# Patient Record
Sex: Female | Born: 1965 | Race: White | Hispanic: No | Marital: Married | Smoking: Never smoker
Health system: Southern US, Community
[De-identification: ages and names within clinical notes are randomized; demographics above are authoritative.]

## PROBLEM LIST (undated history)

## (undated) DIAGNOSIS — M722 Plantar fascial fibromatosis: Secondary | ICD-10-CM

## (undated) DIAGNOSIS — Z889 Allergy status to unspecified drugs, medicaments and biological substances status: Secondary | ICD-10-CM

## (undated) DIAGNOSIS — K219 Gastro-esophageal reflux disease without esophagitis: Secondary | ICD-10-CM

## (undated) DIAGNOSIS — Z9889 Other specified postprocedural states: Secondary | ICD-10-CM

## (undated) DIAGNOSIS — R112 Nausea with vomiting, unspecified: Secondary | ICD-10-CM

## (undated) DIAGNOSIS — F32A Depression, unspecified: Secondary | ICD-10-CM

## (undated) DIAGNOSIS — F419 Anxiety disorder, unspecified: Secondary | ICD-10-CM

## (undated) DIAGNOSIS — T4145XA Adverse effect of unspecified anesthetic, initial encounter: Secondary | ICD-10-CM

## (undated) DIAGNOSIS — J398 Other specified diseases of upper respiratory tract: Secondary | ICD-10-CM

## (undated) DIAGNOSIS — F329 Major depressive disorder, single episode, unspecified: Secondary | ICD-10-CM

## (undated) DIAGNOSIS — T8859XA Other complications of anesthesia, initial encounter: Secondary | ICD-10-CM

## (undated) HISTORY — PX: HAND SURGERY: SHX662

## (undated) HISTORY — PX: WISDOM TOOTH EXTRACTION: SHX21

## (undated) HISTORY — PX: ENDOMETRIAL ABLATION: SHX621

---

## 1990-01-10 HISTORY — PX: SHOULDER ARTHROSCOPY: SHX128

## 1998-01-02 ENCOUNTER — Emergency Department (HOSPITAL_COMMUNITY): Admission: EM | Admit: 1998-01-02 | Discharge: 1998-01-02 | Payer: Self-pay | Admitting: Emergency Medicine

## 2005-08-26 ENCOUNTER — Encounter: Admission: RE | Admit: 2005-08-26 | Discharge: 2005-08-26 | Payer: Self-pay | Admitting: Obstetrics and Gynecology

## 2006-10-19 ENCOUNTER — Encounter: Admission: RE | Admit: 2006-10-19 | Discharge: 2006-10-19 | Payer: Self-pay | Admitting: Obstetrics and Gynecology

## 2007-10-23 ENCOUNTER — Encounter: Admission: RE | Admit: 2007-10-23 | Discharge: 2007-10-23 | Payer: Self-pay | Admitting: Obstetrics and Gynecology

## 2008-12-11 ENCOUNTER — Encounter: Admission: RE | Admit: 2008-12-11 | Discharge: 2008-12-11 | Payer: Self-pay | Admitting: Obstetrics and Gynecology

## 2010-01-20 ENCOUNTER — Encounter
Admission: RE | Admit: 2010-01-20 | Discharge: 2010-01-20 | Payer: Self-pay | Source: Home / Self Care | Attending: Obstetrics and Gynecology | Admitting: Obstetrics and Gynecology

## 2011-02-09 ENCOUNTER — Other Ambulatory Visit: Payer: Self-pay | Admitting: Obstetrics and Gynecology

## 2011-02-09 DIAGNOSIS — Z1231 Encounter for screening mammogram for malignant neoplasm of breast: Secondary | ICD-10-CM

## 2011-02-22 ENCOUNTER — Other Ambulatory Visit: Payer: Self-pay | Admitting: Orthopedic Surgery

## 2011-02-22 DIAGNOSIS — M79646 Pain in unspecified finger(s): Secondary | ICD-10-CM

## 2011-02-28 ENCOUNTER — Ambulatory Visit
Admission: RE | Admit: 2011-02-28 | Discharge: 2011-02-28 | Disposition: A | Discharge status: Home / Self Care | Payer: BC Managed Care – PPO | Source: Ambulatory Visit | Attending: Orthopedic Surgery | Admitting: Orthopedic Surgery

## 2011-02-28 DIAGNOSIS — M79646 Pain in unspecified finger(s): Secondary | ICD-10-CM

## 2011-03-02 ENCOUNTER — Ambulatory Visit
Admission: RE | Admit: 2011-03-02 | Discharge: 2011-03-02 | Disposition: A | Discharge status: Home / Self Care | Payer: BC Managed Care – PPO | Source: Ambulatory Visit | Attending: Obstetrics and Gynecology | Admitting: Obstetrics and Gynecology

## 2011-03-02 ENCOUNTER — Other Ambulatory Visit: Payer: Self-pay | Admitting: Orthopedic Surgery

## 2011-03-02 DIAGNOSIS — Z1231 Encounter for screening mammogram for malignant neoplasm of breast: Secondary | ICD-10-CM

## 2011-03-07 ENCOUNTER — Encounter (HOSPITAL_BASED_OUTPATIENT_CLINIC_OR_DEPARTMENT_OTHER): Payer: Self-pay | Admitting: *Deleted

## 2011-03-07 NOTE — Progress Notes (Signed)
No labs needed

## 2011-03-09 ENCOUNTER — Encounter (HOSPITAL_BASED_OUTPATIENT_CLINIC_OR_DEPARTMENT_OTHER): Payer: Self-pay | Admitting: Certified Registered Nurse Anesthetist

## 2011-03-09 ENCOUNTER — Encounter (HOSPITAL_BASED_OUTPATIENT_CLINIC_OR_DEPARTMENT_OTHER): Payer: Self-pay | Admitting: Orthopedic Surgery

## 2011-03-09 ENCOUNTER — Encounter (HOSPITAL_BASED_OUTPATIENT_CLINIC_OR_DEPARTMENT_OTHER)
Admission: RE | Disposition: A | Discharge status: Home Health | Payer: Self-pay | Source: Ambulatory Visit | Attending: Orthopedic Surgery

## 2011-03-09 ENCOUNTER — Ambulatory Visit (HOSPITAL_BASED_OUTPATIENT_CLINIC_OR_DEPARTMENT_OTHER)
Admission: RE | Admit: 2011-03-09 | Discharge: 2011-03-09 | Disposition: A | Discharge status: Home Health | Payer: BC Managed Care – PPO | Source: Ambulatory Visit | Attending: Orthopedic Surgery | Admitting: Orthopedic Surgery

## 2011-03-09 ENCOUNTER — Ambulatory Visit (HOSPITAL_BASED_OUTPATIENT_CLINIC_OR_DEPARTMENT_OTHER): Payer: BC Managed Care – PPO | Admitting: Certified Registered Nurse Anesthetist

## 2011-03-09 DIAGNOSIS — X58XXXA Exposure to other specified factors, initial encounter: Secondary | ICD-10-CM | POA: Insufficient documentation

## 2011-03-09 DIAGNOSIS — S66819A Strain of other specified muscles, fascia and tendons at wrist and hand level, unspecified hand, initial encounter: Secondary | ICD-10-CM | POA: Insufficient documentation

## 2011-03-09 DIAGNOSIS — Y9323 Activity, snow (alpine) (downhill) skiing, snow boarding, sledding, tobogganing and snow tubing: Secondary | ICD-10-CM | POA: Insufficient documentation

## 2011-03-09 DIAGNOSIS — IMO0002 Reserved for concepts with insufficient information to code with codable children: Secondary | ICD-10-CM | POA: Insufficient documentation

## 2011-03-09 DIAGNOSIS — Y998 Other external cause status: Secondary | ICD-10-CM | POA: Insufficient documentation

## 2011-03-09 DIAGNOSIS — S63599A Other specified sprain of unspecified wrist, initial encounter: Secondary | ICD-10-CM | POA: Insufficient documentation

## 2011-03-09 HISTORY — DX: Nausea with vomiting, unspecified: R11.2

## 2011-03-09 HISTORY — DX: Adverse effect of unspecified anesthetic, initial encounter: T41.45XA

## 2011-03-09 HISTORY — DX: Other complications of anesthesia, initial encounter: T88.59XA

## 2011-03-09 HISTORY — DX: Allergy status to unspecified drugs, medicaments and biological substances: Z88.9

## 2011-03-09 HISTORY — DX: Other specified postprocedural states: Z98.890

## 2011-03-09 LAB — POCT HEMOGLOBIN-HEMACUE: Hemoglobin: 13.5 g/dL (ref 12.0–15.0)

## 2011-03-09 SURGERY — REPAIR, LIGAMENT, ULNAR COLLATERAL
Anesthesia: Monitor Anesthesia Care | Site: Thumb | Laterality: Left | Wound class: Clean

## 2011-03-09 MED ORDER — ONDANSETRON HCL 4 MG/2ML IJ SOLN
INTRAMUSCULAR | Status: DC | PRN
  Administered 2011-03-09: 4 mg via INTRAVENOUS

## 2011-03-09 MED ORDER — CHLORHEXIDINE GLUCONATE 4 % EX LIQD: 60.0000 mL | Freq: Once | CUTANEOUS | Status: DC

## 2011-03-09 MED ORDER — MIDAZOLAM HCL 5 MG/5ML IJ SOLN
INTRAMUSCULAR | Status: DC | PRN
  Administered 2011-03-09: 1 mg via INTRAVENOUS

## 2011-03-09 MED ORDER — LACTATED RINGERS IV SOLN
INTRAVENOUS | Status: DC
  Administered 2011-03-09: 13:00:00 via INTRAVENOUS

## 2011-03-09 MED ORDER — CEFAZOLIN SODIUM 1-5 GM-% IV SOLN
1.0000 g | INTRAVENOUS | Status: DC
  Administered 2011-03-09: 1 g via INTRAVENOUS

## 2011-03-09 MED ORDER — FENTANYL CITRATE 0.05 MG/ML IJ SOLN: 25.0000 ug | INTRAMUSCULAR | Status: DC | PRN

## 2011-03-09 MED ORDER — METOCLOPRAMIDE HCL 5 MG/ML IJ SOLN: 10.0000 mg | Freq: Once | INTRAMUSCULAR | Status: DC | PRN

## 2011-03-09 MED ORDER — DEXAMETHASONE SODIUM PHOSPHATE 4 MG/ML IJ SOLN
INTRAMUSCULAR | Status: DC | PRN
  Administered 2011-03-09: 4 mg via INTRAVENOUS

## 2011-03-09 MED ORDER — CEFAZOLIN SODIUM 1-5 GM-% IV SOLN: 1.0000 g | INTRAVENOUS | Status: DC

## 2011-03-09 MED ORDER — FENTANYL CITRATE 0.05 MG/ML IJ SOLN
50.0000 ug | INTRAMUSCULAR | Status: DC | PRN
  Administered 2011-03-09: 100 ug via INTRAVENOUS

## 2011-03-09 MED ORDER — ROPIVACAINE HCL 5 MG/ML IJ SOLN
INTRAMUSCULAR | Status: DC | PRN
  Administered 2011-03-09: 25 mL via EPIDURAL

## 2011-03-09 MED ORDER — MORPHINE SULFATE 4 MG/ML IJ SOLN: 0.0500 mg/kg | INTRAMUSCULAR | Status: DC | PRN

## 2011-03-09 MED ORDER — PROPOFOL 10 MG/ML IV EMUL
INTRAVENOUS | Status: DC | PRN
  Administered 2011-03-09: 75 ug/kg/min via INTRAVENOUS

## 2011-03-09 MED ORDER — HYDROCODONE-ACETAMINOPHEN 5-500 MG PO TABS: 1.0000 | ORAL_TABLET | ORAL | Status: AC | PRN

## 2011-03-09 MED ORDER — LIDOCAINE HCL (CARDIAC) 20 MG/ML IV SOLN
INTRAVENOUS | Status: DC | PRN
  Administered 2011-03-09: 60 mg via INTRAVENOUS

## 2011-03-09 MED ORDER — MIDAZOLAM HCL 2 MG/2ML IJ SOLN
0.5000 mg | INTRAMUSCULAR | Status: DC | PRN
  Administered 2011-03-09: 2 mg via INTRAVENOUS

## 2011-03-09 MED ORDER — LIDOCAINE HCL 1 % IJ SOLN
INTRAMUSCULAR | Status: DC | PRN
  Administered 2011-03-09: 2 mL via INTRADERMAL

## 2011-03-09 SURGICAL SUPPLY — 62 items
ANCHOR JUGGERKNOT SHT 1.4 SOFT (Anchor) ×2 IMPLANT
BAG DECANTER FOR FLEXI CONT (MISCELLANEOUS) IMPLANT
BANDAGE GAUZE ELAST BULKY 4 IN (GAUZE/BANDAGES/DRESSINGS) ×2 IMPLANT
BLADE MINI RND TIP GREEN BEAV (BLADE) ×2 IMPLANT
BLADE SURG 15 STRL LF DISP TIS (BLADE) ×1 IMPLANT
BLADE SURG 15 STRL SS (BLADE) ×2
BNDG CMPR 9X4 STRL LF SNTH (GAUZE/BANDAGES/DRESSINGS) ×1
BNDG COHESIVE 3X5 TAN STRL LF (GAUZE/BANDAGES/DRESSINGS) ×2 IMPLANT
BNDG ESMARK 4X9 LF (GAUZE/BANDAGES/DRESSINGS) ×2 IMPLANT
CHLORAPREP W/TINT 26ML (MISCELLANEOUS) ×2 IMPLANT
CLOTH BEACON ORANGE TIMEOUT ST (SAFETY) ×2 IMPLANT
CORDS BIPOLAR (ELECTRODE) ×2 IMPLANT
COVER MAYO STAND STRL (DRAPES) ×2 IMPLANT
COVER TABLE BACK 60X90 (DRAPES) ×2 IMPLANT
CUFF TOURNIQUET SINGLE 18IN (TOURNIQUET CUFF) ×2 IMPLANT
DECANTER SPIKE VIAL GLASS SM (MISCELLANEOUS) IMPLANT
DRAPE EXTREMITY T 121X128X90 (DRAPE) ×2 IMPLANT
DRAPE OEC MINIVIEW 54X84 (DRAPES) IMPLANT
DRAPE SURG 17X23 STRL (DRAPES) ×2 IMPLANT
DRSG KUZMA FLUFF (GAUZE/BANDAGES/DRESSINGS) IMPLANT
GAUZE XEROFORM 1X8 LF (GAUZE/BANDAGES/DRESSINGS) ×2 IMPLANT
GLOVE BIO SURGEON STRL SZ 6.5 (GLOVE) ×2 IMPLANT
GLOVE SURG ORTHO 8.0 STRL STRW (GLOVE) ×2 IMPLANT
GOWN BRE IMP PREV XXLGXLNG (GOWN DISPOSABLE) ×2 IMPLANT
GOWN PREVENTION PLUS XLARGE (GOWN DISPOSABLE) ×2 IMPLANT
NDL SAFETY ECLIPSE 18X1.5 (NEEDLE) IMPLANT
NEEDLE 27GAX1X1/2 (NEEDLE) IMPLANT
NEEDLE FISTULA 1/2 CIRCLE (NEEDLE) IMPLANT
NEEDLE HYPO 18GX1.5 SHARP (NEEDLE)
NS IRRIG 1000ML POUR BTL (IV SOLUTION) ×2 IMPLANT
PACK BASIN DAY SURGERY FS (CUSTOM PROCEDURE TRAY) ×2 IMPLANT
PAD CAST 3X4 CTTN HI CHSV (CAST SUPPLIES) ×1 IMPLANT
PAD CAST 4YDX4 CTTN HI CHSV (CAST SUPPLIES) IMPLANT
PADDING CAST ABS 4INX4YD NS (CAST SUPPLIES) ×1
PADDING CAST ABS COTTON 4X4 ST (CAST SUPPLIES) ×1 IMPLANT
PADDING CAST COTTON 3X4 STRL (CAST SUPPLIES) ×2
PADDING CAST COTTON 4X4 STRL (CAST SUPPLIES)
PASSER SUT SWANSON 36MM LOOP (INSTRUMENTS) IMPLANT
SLEEVE SCD COMPRESS KNEE MED (MISCELLANEOUS) ×2 IMPLANT
SPLINT PLASTER CAST XFAST 3X15 (CAST SUPPLIES) ×15 IMPLANT
SPLINT PLASTER XTRA FASTSET 3X (CAST SUPPLIES) ×15
SPONGE GAUZE 4X4 12PLY (GAUZE/BANDAGES/DRESSINGS) ×2 IMPLANT
STOCKINETTE 4X48 STRL (DRAPES) ×2 IMPLANT
SUT ETHIBOND 3-0 V-5 (SUTURE) IMPLANT
SUT FIBERWIRE 2-0 18 17.9 3/8 (SUTURE)
SUT FIBERWIRE 3-0 18 TAPR NDL (SUTURE)
SUT MERSILENE 2.0 SH NDLE (SUTURE) IMPLANT
SUT MERSILENE 4 0 P 3 (SUTURE) ×2 IMPLANT
SUT MERSILENE 6 0 P 1 (SUTURE) IMPLANT
SUT SILK 4 0 PS 2 (SUTURE) IMPLANT
SUT STEEL 3 0 (SUTURE) IMPLANT
SUT STEEL 4 0 (SUTURE) IMPLANT
SUT STEEL 4 0 V 26 (SUTURE) IMPLANT
SUT VICRYL 4-0 PS2 18IN ABS (SUTURE) IMPLANT
SUT VICRYL RAPIDE 4/0 PS 2 (SUTURE) ×2 IMPLANT
SUTURE FIBERWR 2-0 18 17.9 3/8 (SUTURE) IMPLANT
SUTURE FIBERWR 3-0 18 TAPR NDL (SUTURE) IMPLANT
SYR BULB 3OZ (MISCELLANEOUS) ×2 IMPLANT
SYR CONTROL 10ML LL (SYRINGE) IMPLANT
TOWEL OR 17X24 6PK STRL BLUE (TOWEL DISPOSABLE) ×2 IMPLANT
UNDERPAD 30X30 INCONTINENT (UNDERPADS AND DIAPERS) ×2 IMPLANT
WATER STERILE IRR 1000ML POUR (IV SOLUTION) IMPLANT

## 2011-03-09 NOTE — Brief Op Note (Signed)
03/09/2011  2:36 PM  PATIENT:  Olevia Perches  46 y.o. female  PRE-OPERATIVE DIAGNOSIS:  game keepers left thumb  POST-OPERATIVE DIAGNOSIS:  Game Keepers Left Thumb  PROCEDURE:  Procedure(s) (LRB): ULNAR COLLATERAL LIGAMENT REPAIR (Left)  SURGEON:  Surgeon(s) and Role:    * Nicki Reaper, MD - Primary  PHYSICIAN ASSISTANT:   ASSISTANTS: none   ANESTHESIA:   regional and general  EBL:  Total I/O In: 900 [I.V.:900] Out: -   BLOOD ADMINISTERED:none  DRAINS: none   LOCAL MEDICATIONS USED:  NONE  SPECIMEN:  No Specimen  DISPOSITION OF SPECIMEN:  N/A  COUNTS:  YES  TOURNIQUET:   Total Tourniquet Time Documented: Upper Arm (Left) - 41 minutes  DICTATION: .Other Dictation: Dictation Number [id number]  PLAN OF CARE: Discharge to home after PACU  PATIENT DISPOSITION:  PACU - hemodynamically stable.

## 2011-03-09 NOTE — Anesthesia Preprocedure Evaluation (Signed)
Anesthesia Evaluation  Patient identified by MRN, date of birth, ID band Patient awake    Reviewed: Allergy & Precautions, H&P , NPO status , Patient's Chart, lab work & pertinent test results, reviewed documented beta blocker date and time   History of Anesthesia Complications (+) PONV  Airway Mallampati: II TM Distance: >3 FB Neck ROM: full    Dental   Pulmonary neg pulmonary ROS,          Cardiovascular neg cardio ROS     Neuro/Psych Negative Neurological ROS  Negative Psych ROS   GI/Hepatic negative GI ROS, Neg liver ROS,   Endo/Other  Negative Endocrine ROS  Renal/GU negative Renal ROS  Genitourinary negative   Musculoskeletal   Abdominal   Peds  Hematology negative hematology ROS (+)   Anesthesia Other Findings See surgeon's H&P   Reproductive/Obstetrics negative OB ROS                           Anesthesia Physical Anesthesia Plan  ASA: I  Anesthesia Plan: MAC   Post-op Pain Management: MAC Combined w/ Regional for Post-op pain   Induction:   Airway Management Planned: Simple Face Mask  Additional Equipment:   Intra-op Plan:   Post-operative Plan:   Informed Consent: I have reviewed the patients History and Physical, chart, labs and discussed the procedure including the risks, benefits and alternatives for the proposed anesthesia with the patient or authorized representative who has indicated his/her understanding and acceptance.     Plan Discussed with: CRNA and Surgeon  Anesthesia Plan Comments:         Anesthesia Quick Evaluation

## 2011-03-09 NOTE — Progress Notes (Signed)
Tolerated well ,  Resting at present

## 2011-03-09 NOTE — Anesthesia Procedure Notes (Addendum)
Anesthesia Regional Block:  Supraclavicular block  Pre-Anesthetic Checklist: ,, timeout performed, Correct Patient, Correct Site, Correct Laterality, Correct Procedure, Correct Position, site marked, Risks and benefits discussed,  Surgical consent,  Pre-op evaluation,  At surgeon's request and post-op pain management  Laterality: Left  Prep: chloraprep       Needles:   Needle Type: Other   (Arrow Echogenic)   Needle Length: 9cm  Needle Gauge: 21    Additional Needles:  Procedures: ultrasound guided Supraclavicular block Narrative:  Start time: 03/09/2011 1:04 PM End time: 03/09/2011 1:11 PM Injection made incrementally with aspirations every 5 mL.  Performed by: Personally  Anesthesiologist: C Frederick  Additional Notes: Ultrasound guidance used to: id relevant anatomy, confirm needle position, local anesthetic spread, avoidance of vascular puncture. Picture saved. No complications. Block performed personally by Janetta Hora. Gelene Mink, MD    Supraclavicular block Procedure Name: MAC Date/Time: 03/09/2011 1:42 PM Performed by: Donye Dauenhauer D Pre-anesthesia Checklist: Patient identified, Emergency Drugs available, Suction available, Patient being monitored and Timeout performed Oxygen Delivery Method: Simple face mask

## 2011-03-09 NOTE — H&P (Signed)
Toni Gordon is a 46 year old right hand dominant female referred by Dr. Isabel Caprice for a consultation with respect to an injury to her left thumb while skiing. This injury occurred on 02-16-11. She was placed in a splint. This has helped with her pain. She states that she has hurt it in the past but not to this extent. No history of diabetes, thyroid problems, arthritis or gout. She localizes the pain around her MCP joint. She complains of intermittent, severe, sharp pain especially if she jams it with a feeling of swelling and weakness. She states it has gotten somewhat better. Rest, heat, ice and elevation have helped. She has been taking Advil and wearing a brace.   Past Medical History: She has no allergies. She is on vitamins and allergy shots. She has had a shoulder impingement.  Family Medical History: Positive for arthritis, otherwise negative.  Social History: She does not smoke. She drinks socially. She is married. She is Interior and spatial designer for [employer].  Review of Systems: Positive for glasses, otherwise negative for 14 points. Toni Gordon is an 46 y.o. female.   Chief Complaint: UCL MCP Lt Thumb Tear HPI: see above  Past Medical History  Diagnosis Date  . Complication of anesthesia   . PONV (postoperative nausea and vomiting)   . Multiple allergies     Past Surgical History  Procedure Date  . Shoulder arthroscopy 1992    lt ligament repair  . Wisdom tooth extraction     History reviewed. No pertinent family history. Social History:  reports that she has never smoked. She does not have any smokeless tobacco history on file. She reports that she drinks alcohol. She reports that she does not use illicit drugs.  Allergies:  Allergies  Allergen Reactions  . Codeine Nausea And Vomiting    No current facility-administered medications on file as of .   Medications Prior to Admission  Medication Sig Dispense Refill  . calcium carbonate (OS-CAL - DOSED IN MG OF  ELEMENTAL CALCIUM) 1250 MG tablet Take 1 tablet by mouth daily.      . cetirizine (ZYRTEC) 10 MG tablet Take 10 mg by mouth daily.      . cholecalciferol (VITAMIN D) 1000 UNITS tablet Take 1,000 Units by mouth daily.      Marland Kitchen desloratadine (CLARINEX) 5 MG tablet Take 5 mg by mouth daily.      . fish oil-omega-3 fatty acids 1000 MG capsule Take 2 g by mouth daily.      Marland Kitchen FLUoxetine (PROZAC) 10 MG tablet Take 10 mg by mouth daily. Takes one 5 days a month      . Multiple Vitamin (MULTIVITAMIN) capsule Take 1 capsule by mouth daily.        No results found for this or any previous visit (from the past 48 hour(s)).  No results found.   Pertinent items are noted in HPI.  Height 5' 4.5" (1.638 m), weight 57.607 kg (127 lb), last menstrual period 02/08/2011.  General appearance: alert, cooperative and appears stated age Head: Normocephalic, without obvious abnormality Neck: no adenopathy Resp: clear to auscultation bilaterally Cardio: regular rate and rhythm, S1, S2 normal, no murmur, click, rub or gallop GI: soft, non-tender; bowel sounds normal; no masses,  no organomegaly Extremities: extremities normal, atraumatic, no cyanosis or edema Pulses: 2+ and symmetric Skin: Skin color, texture, turgor normal. No rashes or lesions Neurologic: Grossly normal Incision/Wound: na  Assessment/Plan We have discussed repair, reconstruction of this.  The pre,  peri and postoperative course were discussed along with the risks and complications.  The patient is aware there is no guarantee with the surgery, possibility of infection, recurrence, injury to arteries, nerves, tendons, incomplete relief of symptoms and dystrophy, the fact that we will attempt to regain stability rather than full mobility, the possibility of using abductor pollicis longus tendon as a reconstructive procedure.  She would like to proceed. This will be scheduled as an outpatient under regional anesthesia, reconstruction, repair ulnar  collateral ligament metacarpophalangeal joint left thumb.  Madolyn Ackroyd R 03/09/2011, 10:33 AM

## 2011-03-09 NOTE — Op Note (Signed)
Dictated number: [id number]

## 2011-03-09 NOTE — Anesthesia Postprocedure Evaluation (Signed)
Anesthesia Post Note  Patient: Toni Gordon  Procedure(s) Performed: Procedure(s) (LRB): ULNAR COLLATERAL LIGAMENT REPAIR (Left)  Anesthesia type: General  Patient location: PACU  Post pain: Pain level controlled  Post assessment: Patient's Cardiovascular Status Stable  Last Vitals:  Filed Vitals:   03/09/11 1515  BP:   Pulse: 55  Temp:   Resp: 16    Post vital signs: Reviewed and stable  Level of consciousness: alert  Complications: No apparent anesthesia complications

## 2011-03-09 NOTE — Transfer of Care (Signed)
Immediate Anesthesia Transfer of Care Note  Patient: Toni Gordon  Procedure(s) Performed: Procedure(s) (LRB): ULNAR COLLATERAL LIGAMENT REPAIR (Left)  Patient Location: PACU  Anesthesia Type: MAC combined with regional for post-op pain  Level of Consciousness: awake, alert , oriented and patient cooperative  Airway & Oxygen Therapy: Patient Spontanous Breathing and Patient connected to face mask oxygen  Post-op Assessment: Report given to PACU RN and Post -op Vital signs reviewed and stable  Post vital signs: Reviewed and stable  Complications: No apparent anesthesia complications

## 2011-03-10 NOTE — Op Note (Signed)
NAME:  Gordon, Toni                 ACCOUNT NO.:  [id number]  MEDICAL RECORD NO.:  [id number]  LOCATION:                                 FACILITY:  PHYSICIAN:  Cindee Salt, M.D.            DATE OF BIRTH:  DATE OF PROCEDURE:  03/09/2011 DATE OF DISCHARGE:                              OPERATIVE REPORT   PREOPERATIVE DIAGNOSIS:  Ruptured ulnar collateral ligament, metacarpophalangeal joint, left thumb.  POSTOPERATIVE DIAGNOSIS:  Ruptured ulnar collateral ligament, metacarpophalangeal joint, left thumb.  OPERATION:  Repair of ulnar collateral ligament, metacarpophalangeal joint, left thumb.  SURGEON:  Cindee Salt, MD  ANESTHESIA:  Supraclavicular block general.  ANESTHESIOLOGIST:  Janetta Hora. Gelene Mink, MD  HISTORY:  The patient is a 46 year old female who suffered an injury to her left thumb.  MRI reveals a rupture of the ulnar collateral ligament where her pain discomfort is, which has not responded to conservative treatment.  She is admitted now for repair of reconstruction.  Pre, peri, and postoperative course have been discussed along with risks and complications.  She is aware that there is no guarantee with the surgery, possibility of infection, recurrence of injury to arteries, nerves, tendons, incomplete relief of symptoms, and dystrophy.  The possibility of further intervention being necessary in the future, but in the fact that we are going for stability rather than full mobility. In the preoperative area, the patient is seen, the extremity marked by both the patient and surgeon, and antibiotic given.  PROCEDURE:  The patient was brought to the operating room where a supraclavicular block and general anesthetic was carried out without difficulty.  She was prepped using ChloraPrep, supine position, left arm free.  A 3-minute dry time was allowed.  Time-out taken, confirming the patient and procedure.  The limb was exsanguinated with an Esmarch bandage.  Tourniquet was  placed high and the arm was inflated to 250 mmHg.  A curvilinear incision was made over the metacarpal proximal phalanx apex to the ulnar side and carried down through the subcutaneous tissue.  Dorsal sensory nerve of the thumb was identified and protected. This was found to be scarred into a center-type lesion, this was dissected free.  The adductor aponeurosis was incised with sharp dissection.  A portion of ligament was also present on the proximal phalanx.  This appeared to be a tear involving both the partial avulsion from the thumb metacarpal neck and distally from the proximal phalanx with majority being from the metacarpal head.  The collateral ligaments were then debrided.  A juggernaut anchor was placed into the metacarpal neck at the origin of the ulnar collateral ligament.  This was inserted. This was then brought through the ligament on the most dorsal aspect, then through the ligament at the base of the proximal phalanx and used this as an anchor to suture and repair the two point ends of the ligament; one proximally and one distally together.  This formed a very secure repair with no opening to stressing on the ulnar side.  The wound was irrigated prior to closure.  The adductor aponeurosis was repaired with figure-of-eight 4-0 Mersilene sutures and the skin  with a subcuticular 4-0 Vicryl repeat sutures.  Sterile compressive dressing, thumb spica splint was applied.  On the deflation of the tourniquet, all fingers were immediately pinked.  She was taken to the recovery room for observation in satisfactory condition.          ______________________________ Cindee Salt, M.D.     GK/MEDQ  D:  03/09/2011  T:  03/10/2011  Job:  [id number]

## 2013-05-27 ENCOUNTER — Other Ambulatory Visit: Payer: Self-pay | Admitting: Obstetrics and Gynecology

## 2013-05-27 DIAGNOSIS — R928 Other abnormal and inconclusive findings on diagnostic imaging of breast: Secondary | ICD-10-CM

## 2013-06-05 ENCOUNTER — Ambulatory Visit
Admission: RE | Admit: 2013-06-05 | Discharge: 2013-06-05 | Disposition: A | Discharge status: Home / Self Care | Payer: Commercial Managed Care - PPO | Source: Ambulatory Visit | Attending: Obstetrics and Gynecology | Admitting: Obstetrics and Gynecology

## 2013-06-05 DIAGNOSIS — R928 Other abnormal and inconclusive findings on diagnostic imaging of breast: Secondary | ICD-10-CM

## 2014-12-24 ENCOUNTER — Encounter (HOSPITAL_BASED_OUTPATIENT_CLINIC_OR_DEPARTMENT_OTHER): Payer: Self-pay | Admitting: *Deleted

## 2014-12-24 ENCOUNTER — Ambulatory Visit: Payer: Self-pay | Admitting: Otolaryngology

## 2014-12-24 NOTE — H&P (Signed)
  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 Discussed  I certainly agree with the diagnosis. I also agree with the plan for microlaryngoscopy with laser treatment of the stenotic segment, and balloon dilation. Recommend she continue on omeprazole indefinitely. I have counseled her on the importance of taking it first thing in the morning and not eating or drinking for one hour. Continue efforts to avoid caffeine and alcohol. It is very likely that she will require multiple procedures in the future. All questions were answered. We will schedule at her convenience. Reason For Visit  Voice and swallowing issues. HPI  She was recently referred to the voice entered [facility] where was discovered that her problem was not her voice but her breathing. She then saw Dr. Joya Gaskins who diagnosed her with subglottic stenosis. Recommendation was made for laser laryngoscopy with balloon dilation. She wants to see if she can have it done locally in [place] and perhaps even before the end of the year. She has a long-standing history of reflux. She enjoys her wine and her caffeine but has made efforts recently to cut back on these. She takes omeprazole with regularity. She is in excellent health otherwise and is very athletic. Her running and other aerobic activities are limited by her difficulty breathing. Allergies  No Known Drug Allergies. Current Meds  Dymenate SOLN;; RPT Zyrtec TABS;; RPT FLUoxetine HCl - 10 MG Oral Capsule;TAKE 1 TO 2 CAPSULES BY MOUTH ONCE DAILY; RPT Omeprazole 40 MG Oral Capsule Delayed Release;TAKE 1 CAPSULE EVERY DAY; RPT Breo Ellipta 100-25 MCG/INH Inhalation Aerosol Powder Breath Activated;INHALE 1 INHALATION ONCE A DAY; RPT. Active Problems  ALLERGIC RHINITIS NEC   (477.8) EDEMA OF LARYNX (User Defined)   (478.6) ESOPHAGEAL REFLUX_   (530.81). PMH  History of allergic rhinitis (V12.69) (Z87.09) History of heartburn (V12.79)  [id number]). PSH  Hand Surgery; thumb Oral Surgery Tooth Extraction Shoulder Surgery; arthroscopic. Family Hx  Family history of hypertension: Father (V59.49) (Z33.49) Family history of osteoporosis: Mother (V27.81) (Z43.62) Family history of pancreatic cancer: Father (V16.0) (Z80.0) Hearing Loss: Father (V19.2) Heartburn: Father Seasonal allergies: Father (J30.2). Personal Hx  Alcohol Use (History); wine  weekly Never a smoker Occasional caffeine consumption. ROS  12 system ROS was obtained and reviewed on the Health Maintenance form dated today.  Positive responses are shown above.  If the symptom is not checked, the patient has denied it. Vital Signs   Recorded by Iran Sizer on 16 Dec 2014 02:52 PM BP:90/68,  BSA Calculated: 1.65 ,  BMI Calculated: 23.00 ,  Weight: 134 lb , BMI: 23 kg/m2,  Height: 5 ft 4 in. Physical Exam  Very healthy-appearing woman. With forced inspiration she has obvious stridor. Using the stethoscope, this is localized to the subglottic area. Lungs are clear. There are no visible neck masses. I did not repeat the remainder of the head and neck examination she had a thorough evaluation at [facility] couple of weeks ago. Signature  Electronically signed by : Izora Gala  M.D.; 12/16/2014 3:52 PM EST.

## 2014-12-25 ENCOUNTER — Encounter (HOSPITAL_BASED_OUTPATIENT_CLINIC_OR_DEPARTMENT_OTHER)
Admission: RE | Disposition: A | Discharge status: Home / Self Care | Payer: Self-pay | Source: Ambulatory Visit | Attending: Otolaryngology

## 2014-12-25 ENCOUNTER — Ambulatory Visit (HOSPITAL_BASED_OUTPATIENT_CLINIC_OR_DEPARTMENT_OTHER): Payer: Commercial Managed Care - PPO | Admitting: Certified Registered"

## 2014-12-25 ENCOUNTER — Encounter (HOSPITAL_BASED_OUTPATIENT_CLINIC_OR_DEPARTMENT_OTHER): Payer: Self-pay | Admitting: Certified Registered"

## 2014-12-25 ENCOUNTER — Ambulatory Visit (HOSPITAL_BASED_OUTPATIENT_CLINIC_OR_DEPARTMENT_OTHER)
Admission: RE | Admit: 2014-12-25 | Discharge: 2014-12-25 | Disposition: A | Discharge status: Home / Self Care | Payer: Commercial Managed Care - PPO | Source: Ambulatory Visit | Attending: Otolaryngology | Admitting: Otolaryngology

## 2014-12-25 DIAGNOSIS — Z885 Allergy status to narcotic agent status: Secondary | ICD-10-CM | POA: Diagnosis not present

## 2014-12-25 DIAGNOSIS — F419 Anxiety disorder, unspecified: Secondary | ICD-10-CM | POA: Insufficient documentation

## 2014-12-25 DIAGNOSIS — K219 Gastro-esophageal reflux disease without esophagitis: Secondary | ICD-10-CM | POA: Insufficient documentation

## 2014-12-25 DIAGNOSIS — J384 Edema of larynx: Secondary | ICD-10-CM | POA: Diagnosis not present

## 2014-12-25 DIAGNOSIS — F329 Major depressive disorder, single episode, unspecified: Secondary | ICD-10-CM | POA: Insufficient documentation

## 2014-12-25 DIAGNOSIS — J309 Allergic rhinitis, unspecified: Secondary | ICD-10-CM | POA: Insufficient documentation

## 2014-12-25 DIAGNOSIS — J386 Stenosis of larynx: Secondary | ICD-10-CM | POA: Diagnosis present

## 2014-12-25 HISTORY — PX: MICROLARYNGOSCOPY WITH LASER AND BALLOON DILATION: SHX5973

## 2014-12-25 HISTORY — DX: Depression, unspecified: F32.A

## 2014-12-25 HISTORY — DX: Anxiety disorder, unspecified: F41.9

## 2014-12-25 HISTORY — DX: Major depressive disorder, single episode, unspecified: F32.9

## 2014-12-25 HISTORY — DX: Other specified diseases of upper respiratory tract: J39.8

## 2014-12-25 HISTORY — DX: Gastro-esophageal reflux disease without esophagitis: K21.9

## 2014-12-25 SURGERY — MICROLARYNGOSCOPY WITH LASER AND BALLOON DILATION
Anesthesia: General | Site: Mouth

## 2014-12-25 MED ORDER — HYDROCODONE-ACETAMINOPHEN 7.5-325 MG PO TABS: 1.0000 | ORAL_TABLET | Freq: Once | ORAL | Status: DC | PRN

## 2014-12-25 MED ORDER — HYDROMORPHONE HCL 1 MG/ML IJ SOLN
INTRAMUSCULAR | Status: AC
  Filled 2014-12-25: qty 1

## 2014-12-25 MED ORDER — LIDOCAINE HCL (CARDIAC) 20 MG/ML IV SOLN
INTRAVENOUS | Status: AC
  Filled 2014-12-25: qty 5

## 2014-12-25 MED ORDER — PROPOFOL 10 MG/ML IV BOLUS
INTRAVENOUS | Status: DC | PRN
  Administered 2014-12-25 (×3): 20 mg via INTRAVENOUS
  Administered 2014-12-25: 200 mg via INTRAVENOUS
  Administered 2014-12-25: 20 mg via INTRAVENOUS

## 2014-12-25 MED ORDER — DEXAMETHASONE SODIUM PHOSPHATE 10 MG/ML IJ SOLN
INTRAMUSCULAR | Status: AC
  Filled 2014-12-25: qty 1

## 2014-12-25 MED ORDER — SUCCINYLCHOLINE CHLORIDE 20 MG/ML IJ SOLN
INTRAMUSCULAR | Status: AC
  Filled 2014-12-25: qty 1

## 2014-12-25 MED ORDER — MIDAZOLAM HCL 2 MG/2ML IJ SOLN
INTRAMUSCULAR | Status: AC
  Filled 2014-12-25: qty 2

## 2014-12-25 MED ORDER — EPINEPHRINE HCL 1 MG/ML IJ SOLN
INTRAMUSCULAR | Status: DC | PRN
  Administered 2014-12-25: 1 mg

## 2014-12-25 MED ORDER — HYDROCODONE-ACETAMINOPHEN 5-325 MG PO TABS: 1.0000 | ORAL_TABLET | Freq: Once | ORAL | Status: DC

## 2014-12-25 MED ORDER — ONDANSETRON HCL 4 MG/2ML IJ SOLN
INTRAMUSCULAR | Status: DC | PRN
  Administered 2014-12-25: 4 mg via INTRAVENOUS

## 2014-12-25 MED ORDER — FENTANYL CITRATE (PF) 100 MCG/2ML IJ SOLN
50.0000 ug | INTRAMUSCULAR | Status: DC | PRN
  Administered 2014-12-25: 50 ug via INTRAVENOUS

## 2014-12-25 MED ORDER — METHYLENE BLUE 1 % INJ SOLN
INTRAMUSCULAR | Status: AC
  Filled 2014-12-25: qty 10

## 2014-12-25 MED ORDER — GLYCOPYRROLATE 0.2 MG/ML IJ SOLN: 0.2000 mg | Freq: Once | INTRAMUSCULAR | Status: DC | PRN

## 2014-12-25 MED ORDER — PROMETHAZINE HCL 25 MG/ML IJ SOLN: 6.2500 mg | INTRAMUSCULAR | Status: DC | PRN

## 2014-12-25 MED ORDER — FENTANYL CITRATE (PF) 100 MCG/2ML IJ SOLN
INTRAMUSCULAR | Status: AC
  Filled 2014-12-25: qty 2

## 2014-12-25 MED ORDER — ONDANSETRON HCL 4 MG/2ML IJ SOLN
INTRAMUSCULAR | Status: AC
  Filled 2014-12-25: qty 2

## 2014-12-25 MED ORDER — EPINEPHRINE HCL 1 MG/ML IJ SOLN
INTRAMUSCULAR | Status: AC
  Filled 2014-12-25: qty 1

## 2014-12-25 MED ORDER — SCOPOLAMINE 1 MG/3DAYS TD PT72
MEDICATED_PATCH | TRANSDERMAL | Status: AC
  Filled 2014-12-25: qty 1

## 2014-12-25 MED ORDER — PROPOFOL 500 MG/50ML IV EMUL
INTRAVENOUS | Status: AC
  Filled 2014-12-25: qty 50

## 2014-12-25 MED ORDER — DEXAMETHASONE SODIUM PHOSPHATE 4 MG/ML IJ SOLN
INTRAMUSCULAR | Status: DC | PRN
  Administered 2014-12-25: 10 mg via INTRAVENOUS

## 2014-12-25 MED ORDER — HYDROMORPHONE HCL 1 MG/ML IJ SOLN
0.2500 mg | INTRAMUSCULAR | Status: DC | PRN
  Administered 2014-12-25 (×4): 0.5 mg via INTRAVENOUS

## 2014-12-25 MED ORDER — HYDROCODONE-ACETAMINOPHEN 5-325 MG PO TABS
ORAL_TABLET | ORAL | Status: AC
  Filled 2014-12-25: qty 1

## 2014-12-25 MED ORDER — LIDOCAINE-EPINEPHRINE 1 %-1:100000 IJ SOLN
INTRAMUSCULAR | Status: AC
  Filled 2014-12-25: qty 1

## 2014-12-25 MED ORDER — MIDAZOLAM HCL 2 MG/2ML IJ SOLN
1.0000 mg | INTRAMUSCULAR | Status: DC | PRN
  Administered 2014-12-25: 2 mg via INTRAVENOUS

## 2014-12-25 MED ORDER — SCOPOLAMINE 1 MG/3DAYS TD PT72
1.0000 | MEDICATED_PATCH | Freq: Once | TRANSDERMAL | Status: DC
  Administered 2014-12-25: 1.5 mg via TRANSDERMAL

## 2014-12-25 MED ORDER — LIDOCAINE HCL (CARDIAC) 20 MG/ML IV SOLN
INTRAVENOUS | Status: DC | PRN
  Administered 2014-12-25: 60 mg via INTRAVENOUS

## 2014-12-25 MED ORDER — LACTATED RINGERS IV SOLN
INTRAVENOUS | Status: DC
  Administered 2014-12-25 (×2): via INTRAVENOUS

## 2014-12-25 SURGICAL SUPPLY — 30 items
BALLN PULM 15 16.5 18X75 (BALLOONS)
BALLN PULMONARY 10-12 (MISCELLANEOUS) ×2 IMPLANT
BALLN PULMONARY 8-10 OD75 (MISCELLANEOUS) IMPLANT
BALLOON PULM 15 16.5 18X75 (BALLOONS) IMPLANT
BANDAGE EYE OVAL (MISCELLANEOUS) ×4 IMPLANT
CANISTER SUCT 1200ML W/VALVE (MISCELLANEOUS) ×2 IMPLANT
DEPRESSOR TONGUE BLADE STERILE (MISCELLANEOUS) ×2 IMPLANT
FILTER 7/8 IN (FILTER) ×2 IMPLANT
GLOVE ECLIPSE 7.5 STRL STRAW (GLOVE) ×2 IMPLANT
GOWN STRL REUS W/ TWL LRG LVL3 (GOWN DISPOSABLE) ×1 IMPLANT
GOWN STRL REUS W/ TWL XL LVL3 (GOWN DISPOSABLE) ×1 IMPLANT
GOWN STRL REUS W/TWL LRG LVL3 (GOWN DISPOSABLE) ×2
GOWN STRL REUS W/TWL XL LVL3 (GOWN DISPOSABLE) ×2
GUARD TEETH (MISCELLANEOUS) ×2 IMPLANT
MARKER SKIN DUAL TIP RULER LAB (MISCELLANEOUS) IMPLANT
NEEDLE SPNL 22GX7 QUINCKE BK (NEEDLE) IMPLANT
NS IRRIG 1000ML POUR BTL (IV SOLUTION) ×2 IMPLANT
PATTIES SURGICAL .5 X3 (DISPOSABLE) ×2 IMPLANT
REDUCTION FITTING 1/4 IN (FILTER) ×2 IMPLANT
SHEET MEDIUM DRAPE 40X70 STRL (DRAPES) ×2 IMPLANT
SLEEVE SCD COMPRESS KNEE MED (MISCELLANEOUS) ×2 IMPLANT
SOLUTION BUTLER CLEAR DIP (MISCELLANEOUS) IMPLANT
SPONGE GAUZE 4X4 12PLY STER LF (GAUZE/BANDAGES/DRESSINGS) ×4 IMPLANT
SURGILUBE 2OZ TUBE FLIPTOP (MISCELLANEOUS) IMPLANT
SYR 5ML LL (SYRINGE) ×2 IMPLANT
SYR CONTROL 10ML LL (SYRINGE) IMPLANT
SYR INFLATE BILIARY GAUGE (MISCELLANEOUS) ×2 IMPLANT
SYR TB 1ML LL NO SAFETY (SYRINGE) IMPLANT
TOWEL OR 17X24 6PK STRL BLUE (TOWEL DISPOSABLE) ×2 IMPLANT
TUBE CONNECTING 20X1/4 (TUBING) ×4 IMPLANT

## 2014-12-25 NOTE — Discharge Instructions (Signed)
Resume regular activity as tolerated.    Post Anesthesia Home Care Instructions  Activity: Get plenty of rest for the remainder of the day. A responsible adult should stay with you for 24 hours following the procedure.  For the next 24 hours, DO NOT: -Drive a car -Paediatric nurse -Drink alcoholic beverages -Take any medication unless instructed by your physician -Make any legal decisions or sign important papers.  Meals: Start with liquid foods such as gelatin or soup. Progress to regular foods as tolerated. Avoid greasy, spicy, heavy foods. If nausea and/or vomiting occur, drink only clear liquids until the nausea and/or vomiting subsides. Call your physician if vomiting continues.  Special Instructions/Symptoms: Your throat may feel dry or sore from the anesthesia or the breathing tube placed in your throat during surgery. If this causes discomfort, gargle with warm salt water. The discomfort should disappear within 24 hours.  If you had a scopolamine patch placed behind your ear for the management of post- operative nausea and/or vomiting:  1. The medication in the patch is effective for 72 hours, after which it should be removed.  Wrap patch in a tissue and discard in the trash. Wash hands thoroughly with soap and water. 2. You may remove the patch earlier than 72 hours if you experience unpleasant side effects which may include dry mouth, dizziness or visual disturbances. 3. Avoid touching the patch. Wash your hands with soap and water after contact with the patch.

## 2014-12-25 NOTE — Op Note (Signed)
OPERATIVE REPORT  DATE OF SURGERY: 12/25/2014  PATIENT:  Toni Gordon,  49 y.o. female  PRE-OPERATIVE DIAGNOSIS:  SUBGLOTTIC STENOSIS   POST-OPERATIVE DIAGNOSIS:  SUBGLOTTIC STENOSIS  PROCEDURE:  Procedure(s): MICROLARYNGOSCOPY WITH LASER AND BALLOON DILATION  SURGEON:  Beckie Salts, MD  ASSISTANTS: None  ANESTHESIA:   General   EBL:  5 ml  DRAINS: None  LOCAL MEDICATIONS USED:  None  SPECIMEN:  none  COUNTS:  Correct  PROCEDURE DETAILS: The patient was taken to the operating room and placed on the operating table in the supine position. Following induction of general endotracheal anesthesia, the table was turned 90. A 4.5 adult endotracheal tube was used initially and was easily placed. The Jako laryngoscope was entered into the oral cavity and used to inspect the larynx. This was attached to the Burchard stand with the suspension apparatus. The microscope was brought into view. The tube was removed and replaced several times throughout the procedure. The stenotic segment was easily identified and seemed to be rather thin. The carbon dioxide laser was attached to the operating microscope and used to create a radial cuts at 9:00, 11:00 and 1:00. 3 W continuous was used. The segment was then dilated with the CRE pulmonary balloon dilator up to 8 atm pressure which correlates to 12 mm. This was repeated. Topical adrenaline was used in the posterior aspect of the trachea to clear up minor bleeding. A #7 endotracheal tube was placed without difficulty. The scope was removed and the patient was awakened extubated and transferred to recovery in stable condition.    PATIENT DISPOSITION:  To PACU, stable

## 2014-12-25 NOTE — Anesthesia Postprocedure Evaluation (Signed)
Anesthesia Post Note  Patient: Toni Gordon  Procedure(s) Performed: Procedure(s) (LRB): MICROLARYNGOSCOPY WITH LASER AND BALLOON DILATION (N/A)  Patient location during evaluation: PACU Anesthesia Type: General Level of consciousness: awake and alert Pain management: pain level controlled Vital Signs Assessment: post-procedure vital signs reviewed and stable Respiratory status: spontaneous breathing Cardiovascular status: blood pressure returned to baseline Anesthetic complications: no    Last Vitals:  Filed Vitals:   12/25/14 1200 12/25/14 1237  BP: 128/63 136/86  Pulse: 54 64  Temp:  36.6 C  Resp: 14 18    Last Pain:  Filed Vitals:   12/25/14 1238  PainSc: 5                  Tiajuana Amass

## 2014-12-25 NOTE — H&P (View-Only) (Signed)
  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 Discussed  I certainly agree with the diagnosis. I also agree with the plan for microlaryngoscopy with laser treatment of the stenotic segment, and balloon dilation. Recommend she continue on omeprazole indefinitely. I have counseled her on the importance of taking it first thing in the morning and not eating or drinking for one hour. Continue efforts to avoid caffeine and alcohol. It is very likely that she will require multiple procedures in the future. All questions were answered. We will schedule at her convenience. Reason For Visit  Voice and swallowing issues. HPI  She was recently referred to the voice entered [facility] where was discovered that her problem was not her voice but her breathing. She then saw Dr. Joya Gaskins who diagnosed her with subglottic stenosis. Recommendation was made for laser laryngoscopy with balloon dilation. She wants to see if she can have it done locally in [place] and perhaps even before the end of the year. She has a long-standing history of reflux. She enjoys her wine and her caffeine but has made efforts recently to cut back on these. She takes omeprazole with regularity. She is in excellent health otherwise and is very athletic. Her running and other aerobic activities are limited by her difficulty breathing. Allergies  No Known Drug Allergies. Current Meds  Dymenate SOLN;; RPT Zyrtec TABS;; RPT FLUoxetine HCl - 10 MG Oral Capsule;TAKE 1 TO 2 CAPSULES BY MOUTH ONCE DAILY; RPT Omeprazole 40 MG Oral Capsule Delayed Release;TAKE 1 CAPSULE EVERY DAY; RPT Breo Ellipta 100-25 MCG/INH Inhalation Aerosol Powder Breath Activated;INHALE 1 INHALATION ONCE A DAY; RPT. Active Problems  ALLERGIC RHINITIS NEC   (477.8) EDEMA OF LARYNX (User Defined)   (478.6) ESOPHAGEAL REFLUX_   (530.81). PMH  History of allergic rhinitis (V12.69) (Z87.09) History of heartburn (V12.79)  [id number]). PSH  Hand Surgery; thumb Oral Surgery Tooth Extraction Shoulder Surgery; arthroscopic. Family Hx  Family history of hypertension: Father (V73.49) (Z59.49) Family history of osteoporosis: Mother (V15.81) (Z46.62) Family history of pancreatic cancer: Father (V16.0) (Z80.0) Hearing Loss: Father (V19.2) Heartburn: Father Seasonal allergies: Father (J30.2). Personal Hx  Alcohol Use (History); wine  weekly Never a smoker Occasional caffeine consumption. ROS  12 system ROS was obtained and reviewed on the Health Maintenance form dated today.  Positive responses are shown above.  If the symptom is not checked, the patient has denied it. Vital Signs   Recorded by Iran Sizer on 16 Dec 2014 02:52 PM BP:90/68,  BSA Calculated: 1.65 ,  BMI Calculated: 23.00 ,  Weight: 134 lb , BMI: 23 kg/m2,  Height: 5 ft 4 in. Physical Exam  Very healthy-appearing woman. With forced inspiration she has obvious stridor. Using the stethoscope, this is localized to the subglottic area. Lungs are clear. There are no visible neck masses. I did not repeat the remainder of the head and neck examination she had a thorough evaluation at [facility] couple of weeks ago. Signature  Electronically signed by : Izora Gala  M.D.; 12/16/2014 3:52 PM EST.

## 2014-12-25 NOTE — Transfer of Care (Signed)
Immediate Anesthesia Transfer of Care Note  Patient: Toni Gordon  Procedure(s) Performed: Procedure(s): MICROLARYNGOSCOPY WITH LASER AND BALLOON DILATION (N/A)  Patient Location: PACU  Anesthesia Type:General  Level of Consciousness: awake, alert , oriented and patient cooperative  Airway & Oxygen Therapy: Patient Spontanous Breathing and Patient connected to face mask oxygen  Post-op Assessment: Report given to RN and Post -op Vital signs reviewed and stable  Post vital signs: Reviewed and stable  Last Vitals:  Filed Vitals:   12/25/14 0917  BP: 108/63  Pulse: 56  Temp: 36.7 C  Resp: 16    Complications: No apparent anesthesia complications

## 2014-12-25 NOTE — Anesthesia Preprocedure Evaluation (Addendum)
Anesthesia Evaluation  Patient identified by MRN, date of birth, ID band Patient awake    Reviewed: Allergy & Precautions, NPO status , Patient's Chart, lab work & pertinent test results  History of Anesthesia Complications (+) PONV  Airway Mallampati: I  TM Distance: >3 FB Neck ROM: Full    Dental  (+) Dental Advisory Given   Pulmonary neg pulmonary ROS,    breath sounds clear to auscultation       Cardiovascular negative cardio ROS   Rhythm:Regular Rate:Normal     Neuro/Psych PSYCHIATRIC DISORDERS Anxiety Depression negative neurological ROS     GI/Hepatic Neg liver ROS, GERD  Medicated,  Endo/Other  negative endocrine ROS  Renal/GU negative Renal ROS     Musculoskeletal   Abdominal   Peds  Hematology negative hematology ROS (+)   Anesthesia Other Findings   Reproductive/Obstetrics                           Anesthesia Physical Anesthesia Plan  ASA: II  Anesthesia Plan: General   Post-op Pain Management:    Induction: Intravenous  Airway Management Planned:   Additional Equipment:   Intra-op Plan:   Post-operative Plan: Extubation in OR  Informed Consent: I have reviewed the patients History and Physical, chart, labs and discussed the procedure including the risks, benefits and alternatives for the proposed anesthesia with the patient or authorized representative who has indicated his/her understanding and acceptance.   Dental advisory given  Plan Discussed with: CRNA and Surgeon  Anesthesia Plan Comments:        Anesthesia Quick Evaluation

## 2014-12-25 NOTE — Interval H&P Note (Signed)
History and Physical Interval Note:  12/25/2014 10:07 AM  [name]  has presented today for surgery, with the diagnosis of SUBGLOTTIC STENOSIS   The various methods of treatment have been discussed with the patient and family. After consideration of risks, benefits and other options for treatment, the patient has consented to  Procedure(s): MICROLARYNGOSCOPY WITH LASER AND BALLOON DILATION (N/A) as a surgical intervention .  The patient's history has been reviewed, patient examined, no change in status, stable for surgery.  I have reviewed the patient's chart and labs.  Questions were answered to the patient's satisfaction.     Toni Gordon

## 2014-12-25 NOTE — Anesthesia Procedure Notes (Signed)
Procedure Name: Intubation Date/Time: 12/25/2014 10:23 AM Performed by: Tamyia Minich D Pre-anesthesia Checklist: Patient identified, Emergency Drugs available, Suction available and Patient being monitored Patient Re-evaluated:Patient Re-evaluated prior to inductionOxygen Delivery Method: Circle System Utilized Preoxygenation: Pre-oxygenation with 100% oxygen Intubation Type: IV induction Ventilation: Mask ventilation without difficulty Laryngoscope Size: Mac and 3 Grade View: Grade I Tube type: Oral Tube size: 4.5 mm Number of attempts: 1 Airway Equipment and Method: Stylet and Oral airway Placement Confirmation: ETT inserted through vocal cords under direct vision,  positive ETCO2 and breath sounds checked- equal and bilateral Secured at: 20 cm Tube secured with: Tape Dental Injury: Teeth and Oropharynx as per pre-operative assessment

## 2014-12-26 ENCOUNTER — Encounter (HOSPITAL_BASED_OUTPATIENT_CLINIC_OR_DEPARTMENT_OTHER): Payer: Self-pay | Admitting: Otolaryngology

## 2015-02-14 IMAGING — US US BREAST*L* LIMITED INC AXILLA
1 series · 6 of 6 positions shown · non-contrast
Comparison: Previous examinations.

CLINICAL DATA: Recall from screening mammography.

EXAM:
DIGITAL DIAGNOSTIC left breast MAMMOGRAM WITH CAD
ULTRASOUND of the left BREAST

[Series 1: superficial breast · 6 of 6 slices shown]
[im 1/6]
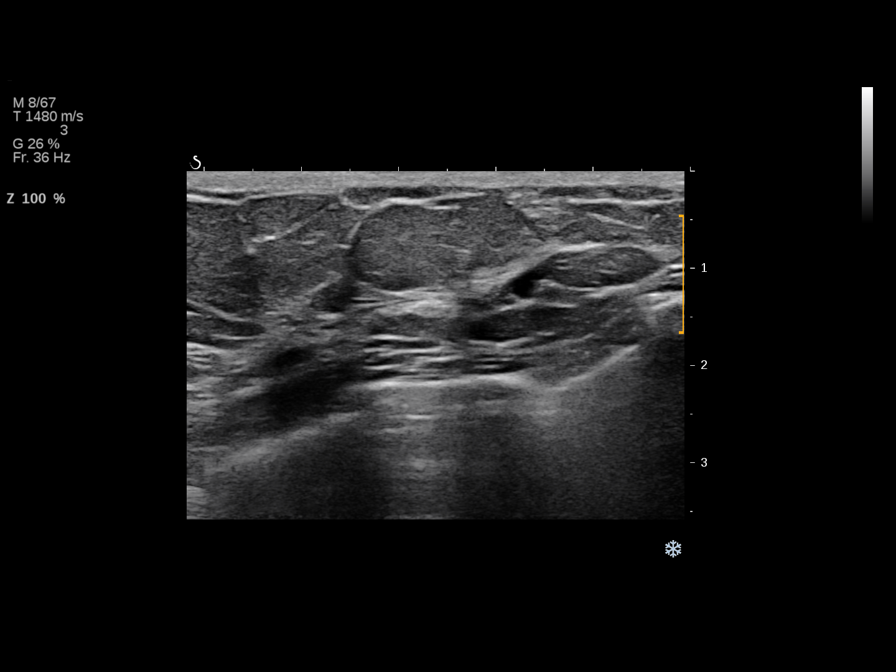
[im 2/6]
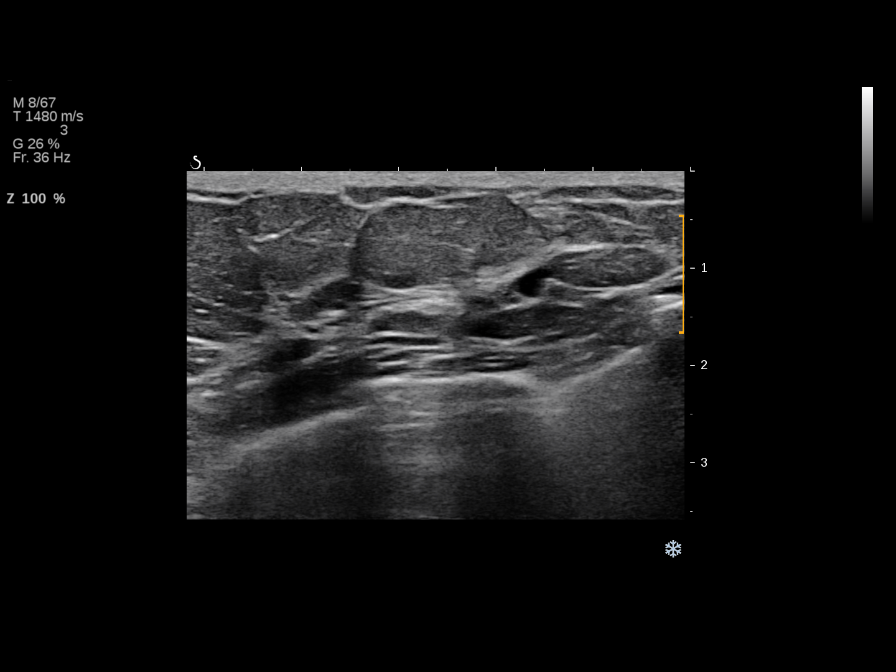
[im 3/6]
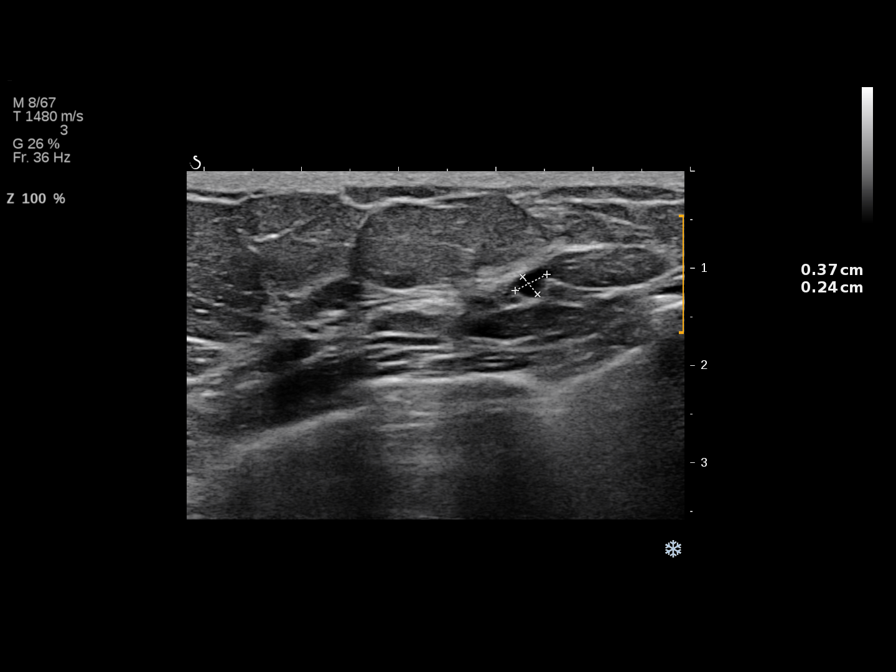
[im 4/6]
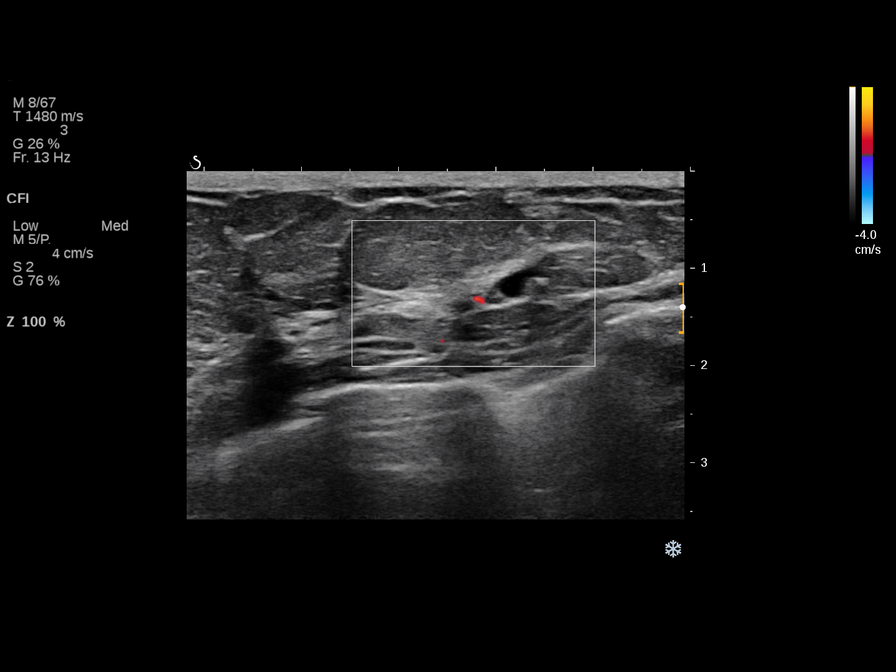
[im 5/6]
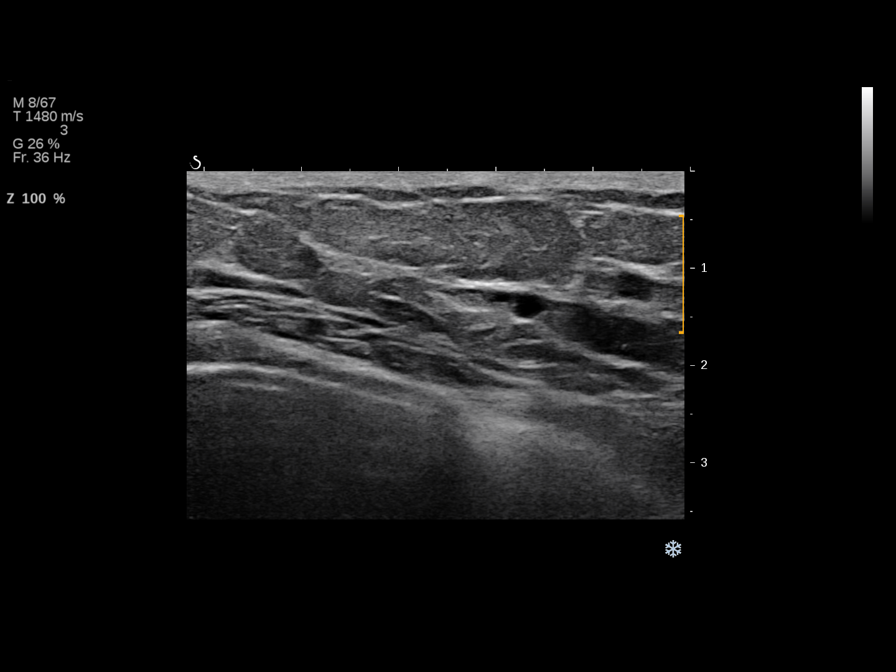
[im 6/6]
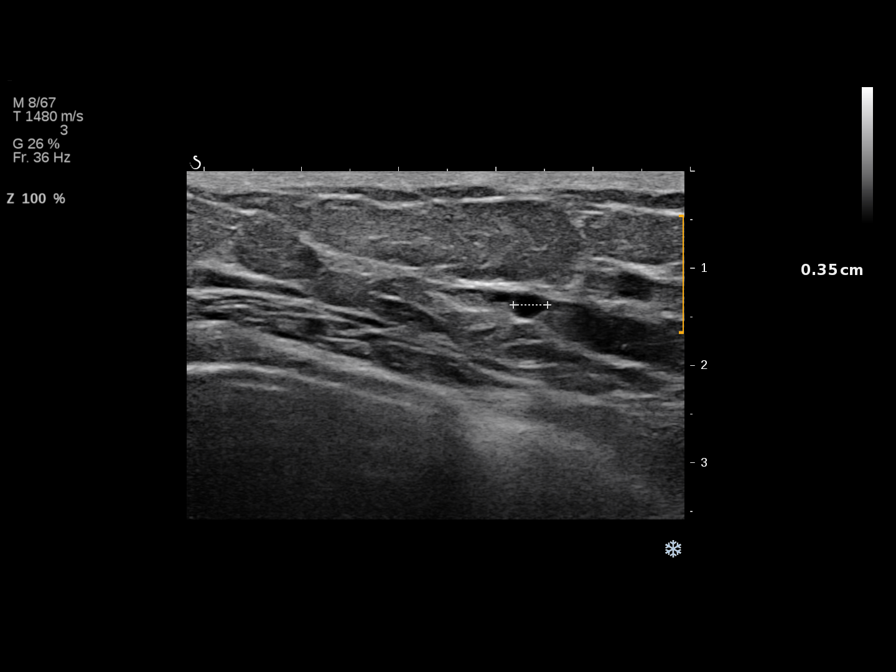

[6 of 6 positions shown; findings below may reference images not displayed]

ACR Breast Density Category c: The breast tissue is heterogeneously
dense, which may obscure small masses.
FINDINGS: Additional views of the left breast were performed. The small, oval
circumscribed nodule located within the inferior left breast is less
well visualized on the spot compression views (when compared with
the 3D mammogram).

Mammographic images were processed with CAD.

On physical exam, there is no discrete palpable abnormality within
the inferior left breast.

Ultrasound is performed, showing a simple cyst located within the
left breast at 6 o'clock position 6 cm from the nipple measuring 4
mm in size and corresponding to the abnormality noted on the recent
3D mammogram. There are no additional findings.
IMPRESSION: 4 mm simple cyst located within the left breast at 6 o'clock
position 6 cm from the nipple. No findings worrisome for malignancy.

RECOMMENDATION:
Bilateral screening mammography in 1 year.

I have discussed the findings and recommendations with the patient.
Results were also provided in writing at the conclusion of the
visit. If applicable, a reminder letter will be sent to the patient
regarding the next appointment.

BI-RADS CATEGORY  2: Benign.

## 2015-02-14 IMAGING — MG MM DIAGNOSTIC UNILATERAL L
3 series · 3 of 3 positions shown · non-contrast
Comparison: Previous examinations.

CLINICAL DATA: Recall from screening mammography.

EXAM:
DIGITAL DIAGNOSTIC left breast MAMMOGRAM WITH CAD
ULTRASOUND of the left BREAST

[L XCCL]
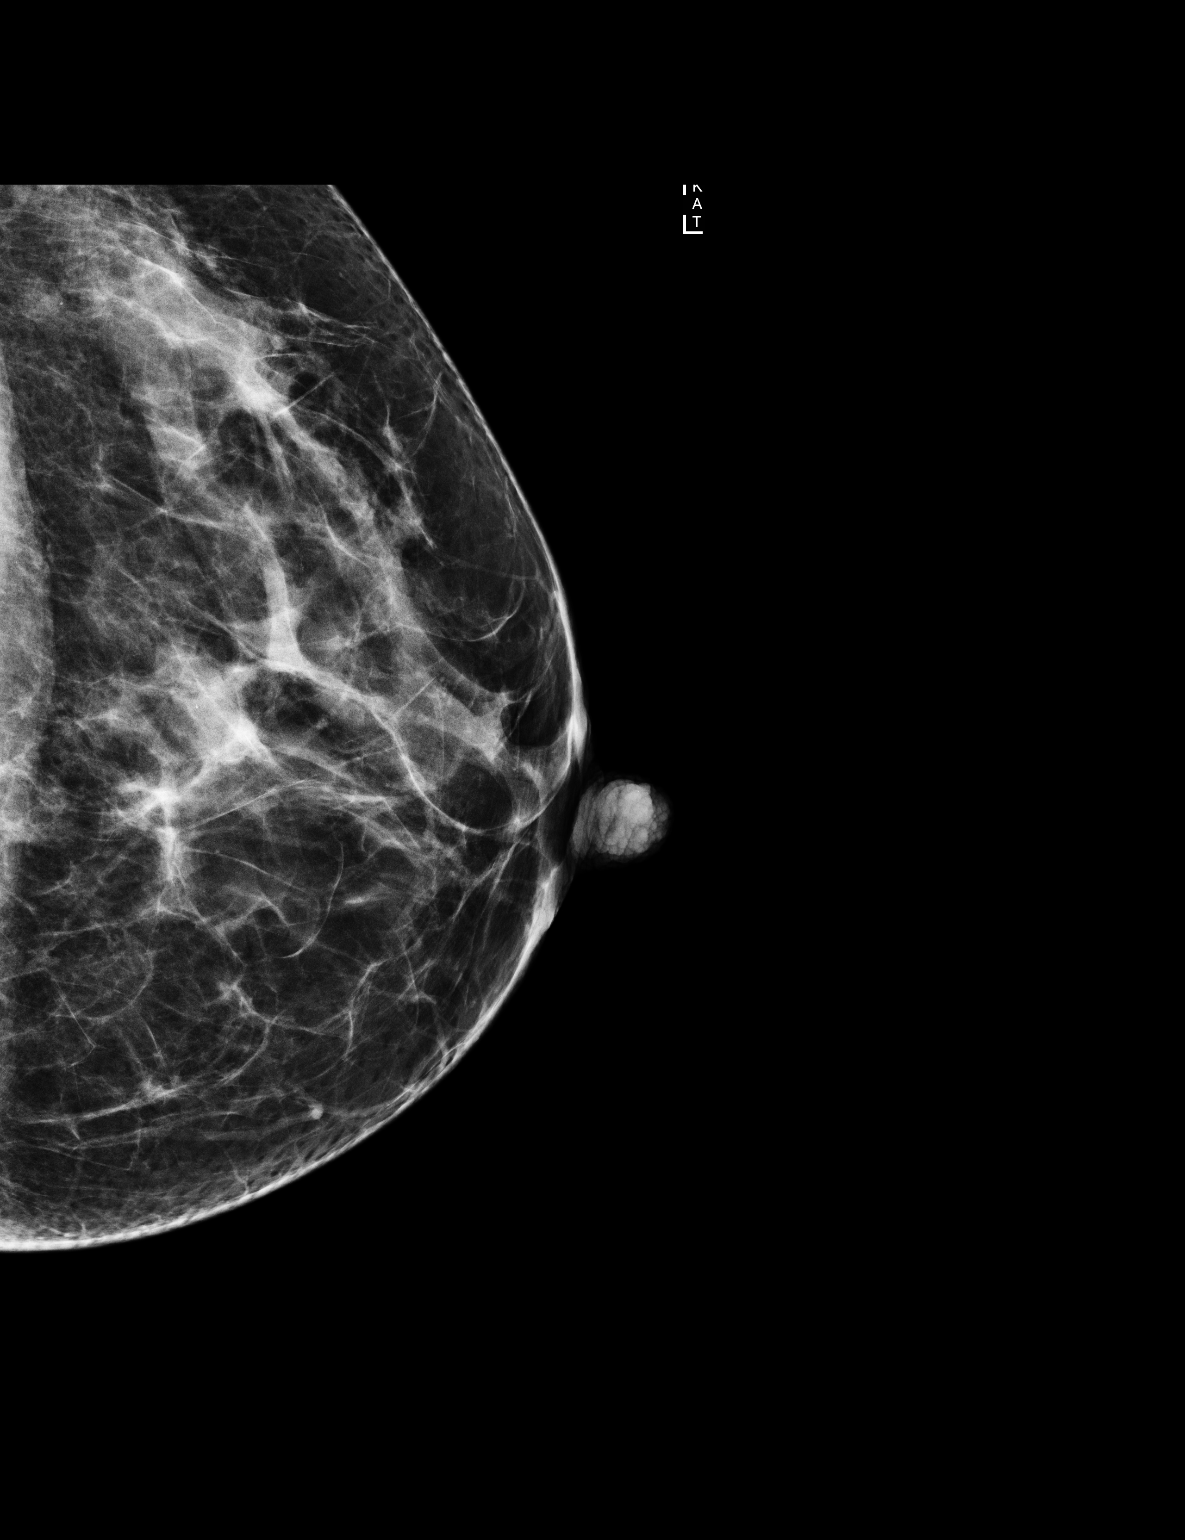

[L ML]
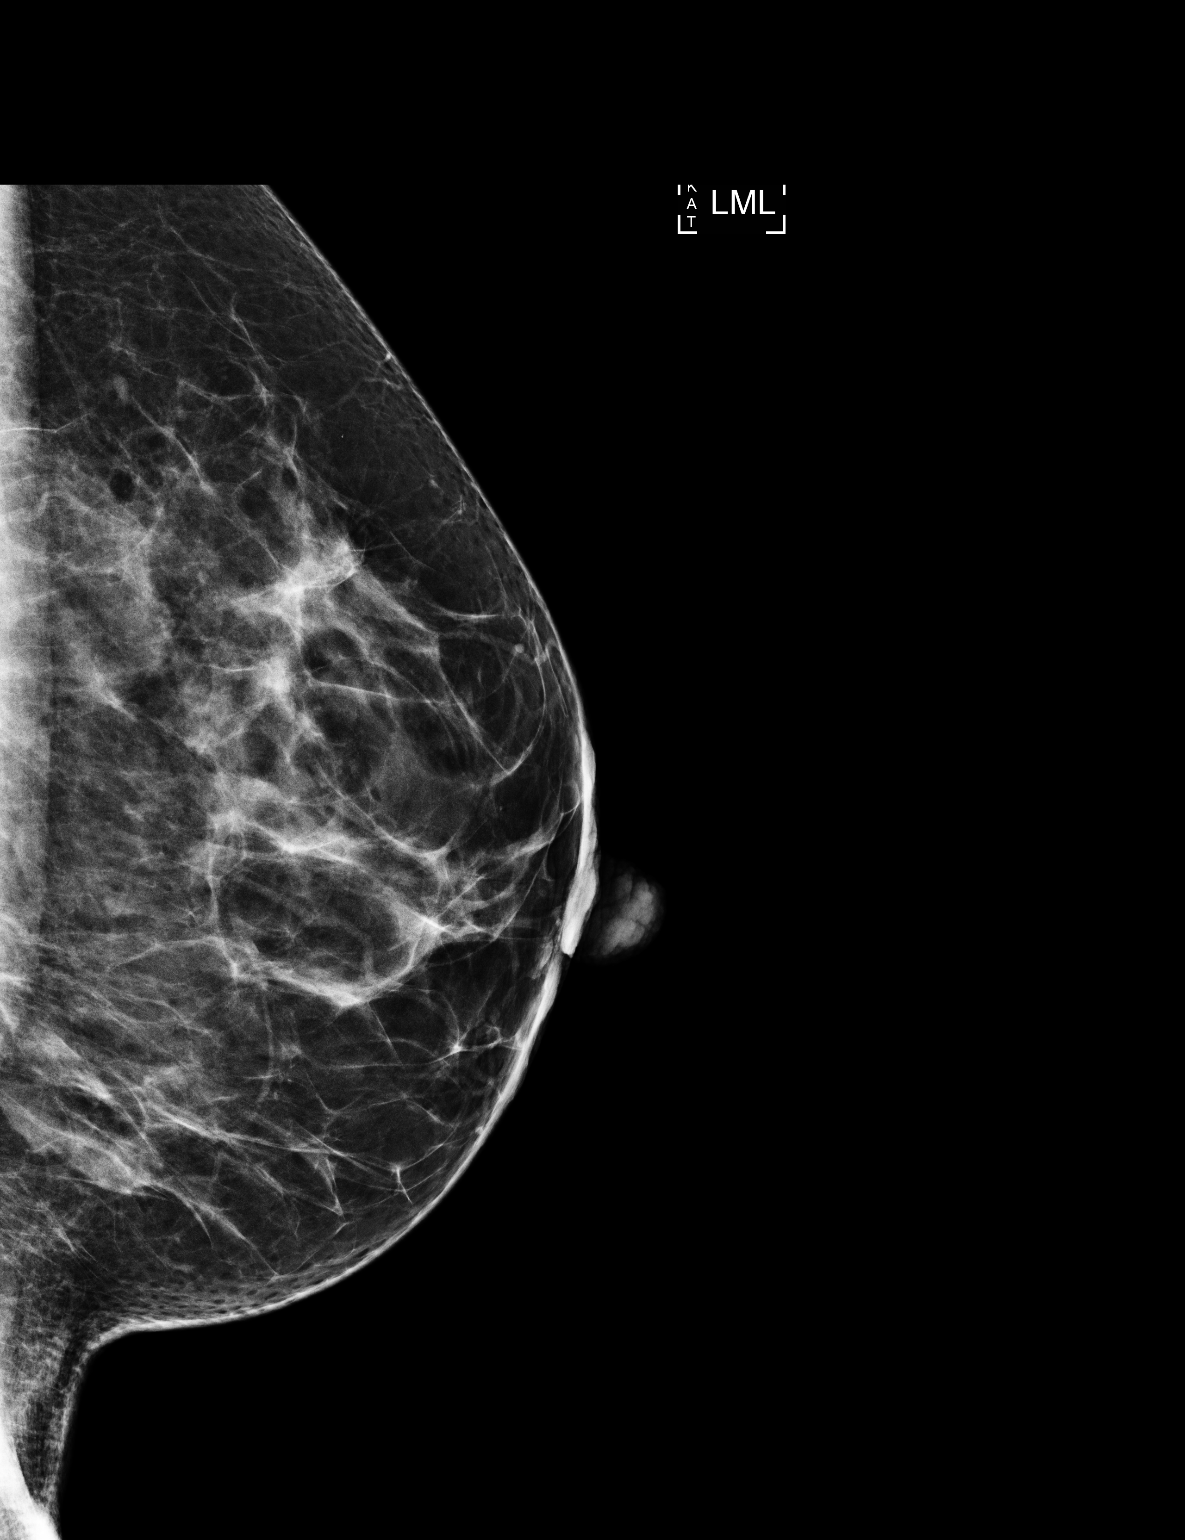

[L MLO]
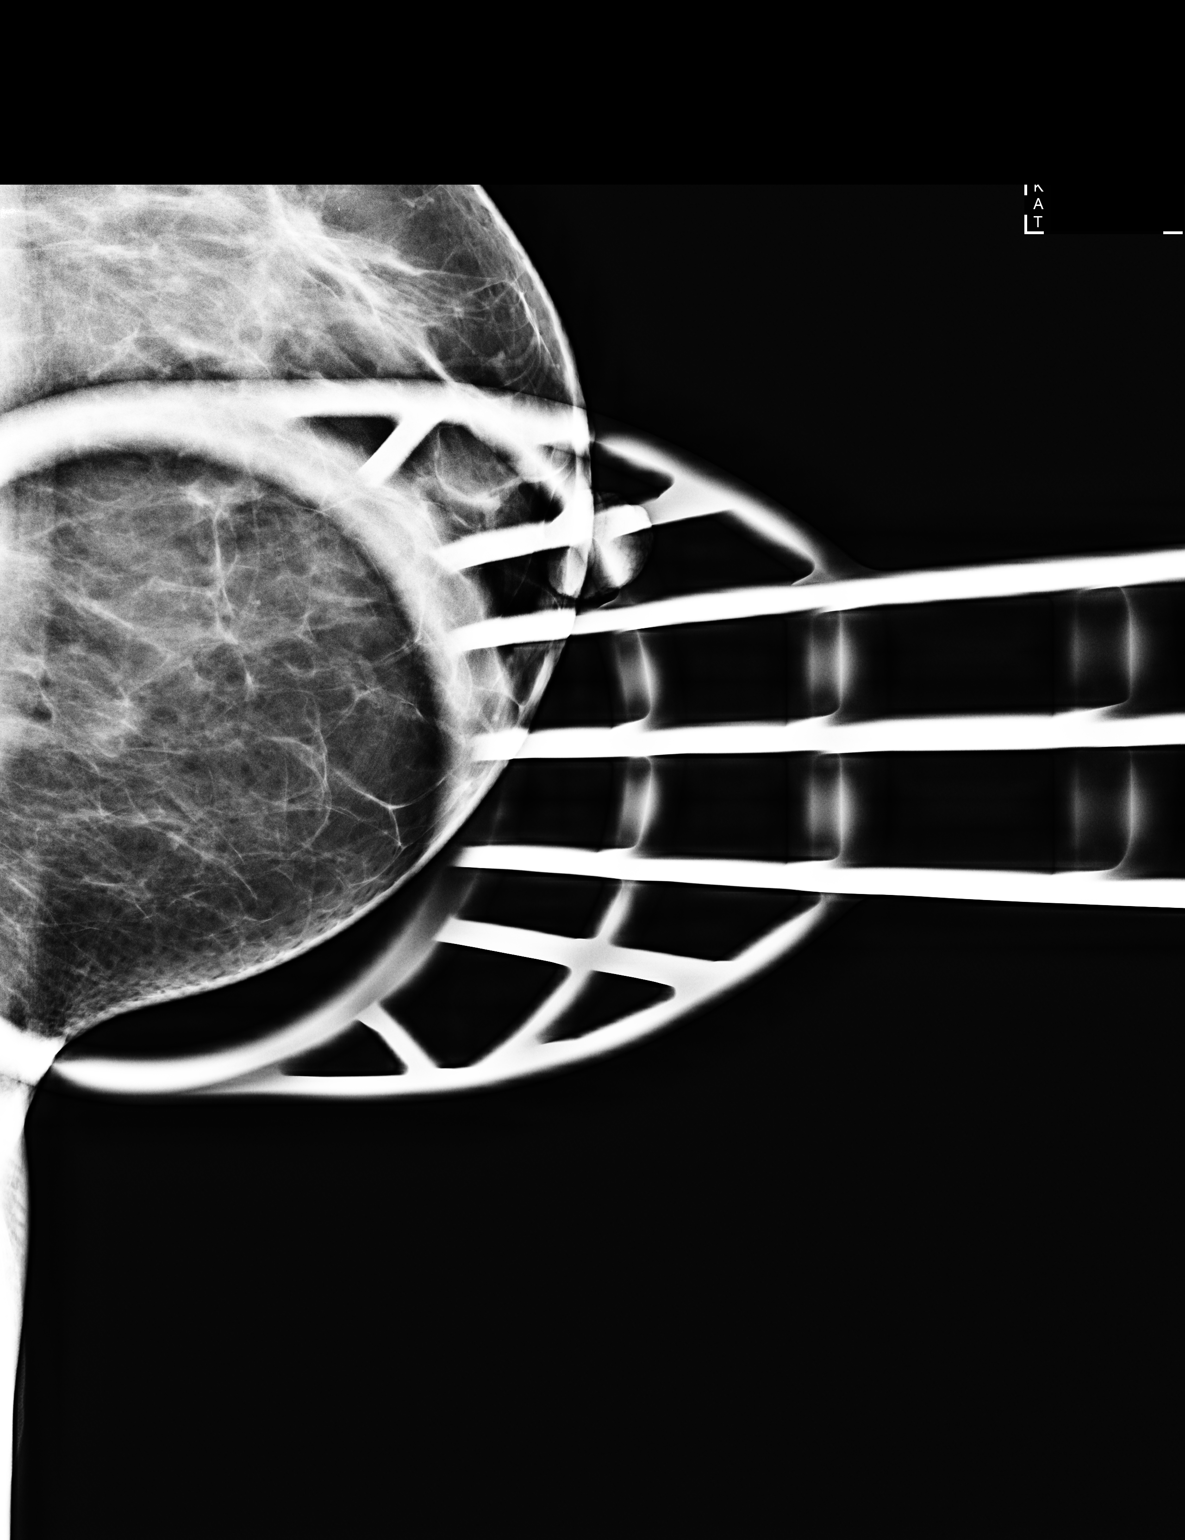

[3 of 3 positions shown; findings below may reference images not displayed]

ACR Breast Density Category c: The breast tissue is heterogeneously
dense, which may obscure small masses.
FINDINGS: Additional views of the left breast were performed. The small, oval
circumscribed nodule located within the inferior left breast is less
well visualized on the spot compression views (when compared with
the 3D mammogram).

Mammographic images were processed with CAD.

On physical exam, there is no discrete palpable abnormality within
the inferior left breast.

Ultrasound is performed, showing a simple cyst located within the
left breast at 6 o'clock position 6 cm from the nipple measuring 4
mm in size and corresponding to the abnormality noted on the recent
3D mammogram. There are no additional findings.
IMPRESSION: 4 mm simple cyst located within the left breast at 6 o'clock
position 6 cm from the nipple. No findings worrisome for malignancy.

RECOMMENDATION:
Bilateral screening mammography in 1 year.

I have discussed the findings and recommendations with the patient.
Results were also provided in writing at the conclusion of the
visit. If applicable, a reminder letter will be sent to the patient
regarding the next appointment.

BI-RADS CATEGORY  2: Benign.

## 2015-11-20 ENCOUNTER — Other Ambulatory Visit: Payer: Self-pay | Admitting: Obstetrics and Gynecology

## 2015-11-20 DIAGNOSIS — R928 Other abnormal and inconclusive findings on diagnostic imaging of breast: Secondary | ICD-10-CM

## 2015-11-25 ENCOUNTER — Ambulatory Visit
Admission: RE | Admit: 2015-11-25 | Discharge: 2015-11-25 | Disposition: A | Discharge status: Home / Self Care | Payer: Commercial Managed Care - PPO | Source: Ambulatory Visit | Attending: Obstetrics and Gynecology | Admitting: Obstetrics and Gynecology

## 2015-11-25 DIAGNOSIS — R928 Other abnormal and inconclusive findings on diagnostic imaging of breast: Secondary | ICD-10-CM

## 2016-01-13 DIAGNOSIS — N951 Menopausal and female climacteric states: Secondary | ICD-10-CM | POA: Diagnosis not present

## 2016-01-14 DIAGNOSIS — J3081 Allergic rhinitis due to animal (cat) (dog) hair and dander: Secondary | ICD-10-CM | POA: Diagnosis not present

## 2016-01-14 DIAGNOSIS — J3089 Other allergic rhinitis: Secondary | ICD-10-CM | POA: Diagnosis not present

## 2016-01-14 DIAGNOSIS — J301 Allergic rhinitis due to pollen: Secondary | ICD-10-CM | POA: Diagnosis not present

## 2016-02-04 DIAGNOSIS — J3081 Allergic rhinitis due to animal (cat) (dog) hair and dander: Secondary | ICD-10-CM | POA: Diagnosis not present

## 2016-02-04 DIAGNOSIS — J301 Allergic rhinitis due to pollen: Secondary | ICD-10-CM | POA: Diagnosis not present

## 2016-02-04 DIAGNOSIS — J3089 Other allergic rhinitis: Secondary | ICD-10-CM | POA: Diagnosis not present

## 2016-02-16 DIAGNOSIS — J301 Allergic rhinitis due to pollen: Secondary | ICD-10-CM | POA: Diagnosis not present

## 2016-02-16 DIAGNOSIS — J3081 Allergic rhinitis due to animal (cat) (dog) hair and dander: Secondary | ICD-10-CM | POA: Diagnosis not present

## 2016-02-16 DIAGNOSIS — J3089 Other allergic rhinitis: Secondary | ICD-10-CM | POA: Diagnosis not present

## 2016-02-18 DIAGNOSIS — L231 Allergic contact dermatitis due to adhesives: Secondary | ICD-10-CM | POA: Diagnosis not present

## 2016-02-18 DIAGNOSIS — J3089 Other allergic rhinitis: Secondary | ICD-10-CM | POA: Diagnosis not present

## 2016-02-18 DIAGNOSIS — L308 Other specified dermatitis: Secondary | ICD-10-CM | POA: Diagnosis not present

## 2016-02-18 DIAGNOSIS — L03032 Cellulitis of left toe: Secondary | ICD-10-CM | POA: Diagnosis not present

## 2016-02-18 DIAGNOSIS — J3081 Allergic rhinitis due to animal (cat) (dog) hair and dander: Secondary | ICD-10-CM | POA: Diagnosis not present

## 2016-03-02 DIAGNOSIS — J3081 Allergic rhinitis due to animal (cat) (dog) hair and dander: Secondary | ICD-10-CM | POA: Diagnosis not present

## 2016-03-02 DIAGNOSIS — J3089 Other allergic rhinitis: Secondary | ICD-10-CM | POA: Diagnosis not present

## 2016-03-02 DIAGNOSIS — J301 Allergic rhinitis due to pollen: Secondary | ICD-10-CM | POA: Diagnosis not present

## 2016-03-16 DIAGNOSIS — J3089 Other allergic rhinitis: Secondary | ICD-10-CM | POA: Diagnosis not present

## 2016-03-16 DIAGNOSIS — J301 Allergic rhinitis due to pollen: Secondary | ICD-10-CM | POA: Diagnosis not present

## 2016-03-16 DIAGNOSIS — J3081 Allergic rhinitis due to animal (cat) (dog) hair and dander: Secondary | ICD-10-CM | POA: Diagnosis not present

## 2016-03-22 DIAGNOSIS — J3081 Allergic rhinitis due to animal (cat) (dog) hair and dander: Secondary | ICD-10-CM | POA: Diagnosis not present

## 2016-03-22 DIAGNOSIS — J301 Allergic rhinitis due to pollen: Secondary | ICD-10-CM | POA: Diagnosis not present

## 2016-03-22 DIAGNOSIS — J3089 Other allergic rhinitis: Secondary | ICD-10-CM | POA: Diagnosis not present

## 2016-03-30 DIAGNOSIS — J3081 Allergic rhinitis due to animal (cat) (dog) hair and dander: Secondary | ICD-10-CM | POA: Diagnosis not present

## 2016-03-30 DIAGNOSIS — J3089 Other allergic rhinitis: Secondary | ICD-10-CM | POA: Diagnosis not present

## 2016-03-30 DIAGNOSIS — J301 Allergic rhinitis due to pollen: Secondary | ICD-10-CM | POA: Diagnosis not present

## 2016-04-01 DIAGNOSIS — J3089 Other allergic rhinitis: Secondary | ICD-10-CM | POA: Diagnosis not present

## 2016-04-01 DIAGNOSIS — J3081 Allergic rhinitis due to animal (cat) (dog) hair and dander: Secondary | ICD-10-CM | POA: Diagnosis not present

## 2016-04-11 DIAGNOSIS — J3089 Other allergic rhinitis: Secondary | ICD-10-CM | POA: Diagnosis not present

## 2016-04-11 DIAGNOSIS — J3081 Allergic rhinitis due to animal (cat) (dog) hair and dander: Secondary | ICD-10-CM | POA: Diagnosis not present

## 2016-04-11 DIAGNOSIS — J301 Allergic rhinitis due to pollen: Secondary | ICD-10-CM | POA: Diagnosis not present

## 2016-04-13 DIAGNOSIS — J3081 Allergic rhinitis due to animal (cat) (dog) hair and dander: Secondary | ICD-10-CM | POA: Diagnosis not present

## 2016-04-13 DIAGNOSIS — J3089 Other allergic rhinitis: Secondary | ICD-10-CM | POA: Diagnosis not present

## 2016-04-20 DIAGNOSIS — J301 Allergic rhinitis due to pollen: Secondary | ICD-10-CM | POA: Diagnosis not present

## 2016-04-20 DIAGNOSIS — J3089 Other allergic rhinitis: Secondary | ICD-10-CM | POA: Diagnosis not present

## 2016-04-20 DIAGNOSIS — J3081 Allergic rhinitis due to animal (cat) (dog) hair and dander: Secondary | ICD-10-CM | POA: Diagnosis not present

## 2016-05-04 DIAGNOSIS — J3089 Other allergic rhinitis: Secondary | ICD-10-CM | POA: Diagnosis not present

## 2016-05-04 DIAGNOSIS — J3081 Allergic rhinitis due to animal (cat) (dog) hair and dander: Secondary | ICD-10-CM | POA: Diagnosis not present

## 2016-05-04 DIAGNOSIS — J301 Allergic rhinitis due to pollen: Secondary | ICD-10-CM | POA: Diagnosis not present

## 2016-05-30 DIAGNOSIS — J3081 Allergic rhinitis due to animal (cat) (dog) hair and dander: Secondary | ICD-10-CM | POA: Diagnosis not present

## 2016-05-30 DIAGNOSIS — J301 Allergic rhinitis due to pollen: Secondary | ICD-10-CM | POA: Diagnosis not present

## 2016-05-30 DIAGNOSIS — J3089 Other allergic rhinitis: Secondary | ICD-10-CM | POA: Diagnosis not present

## 2016-06-08 DIAGNOSIS — D2362 Other benign neoplasm of skin of left upper limb, including shoulder: Secondary | ICD-10-CM | POA: Diagnosis not present

## 2016-06-08 DIAGNOSIS — L814 Other melanin hyperpigmentation: Secondary | ICD-10-CM | POA: Diagnosis not present

## 2016-06-08 DIAGNOSIS — D2272 Melanocytic nevi of left lower limb, including hip: Secondary | ICD-10-CM | POA: Diagnosis not present

## 2016-06-13 DIAGNOSIS — J3089 Other allergic rhinitis: Secondary | ICD-10-CM | POA: Diagnosis not present

## 2016-06-13 DIAGNOSIS — J301 Allergic rhinitis due to pollen: Secondary | ICD-10-CM | POA: Diagnosis not present

## 2016-06-13 DIAGNOSIS — J3081 Allergic rhinitis due to animal (cat) (dog) hair and dander: Secondary | ICD-10-CM | POA: Diagnosis not present

## 2016-07-05 DIAGNOSIS — J301 Allergic rhinitis due to pollen: Secondary | ICD-10-CM | POA: Diagnosis not present

## 2016-07-05 DIAGNOSIS — J3081 Allergic rhinitis due to animal (cat) (dog) hair and dander: Secondary | ICD-10-CM | POA: Diagnosis not present

## 2016-07-05 DIAGNOSIS — J3089 Other allergic rhinitis: Secondary | ICD-10-CM | POA: Diagnosis not present

## 2016-07-08 DIAGNOSIS — J301 Allergic rhinitis due to pollen: Secondary | ICD-10-CM | POA: Diagnosis not present

## 2016-07-19 DIAGNOSIS — J301 Allergic rhinitis due to pollen: Secondary | ICD-10-CM | POA: Diagnosis not present

## 2016-07-26 DIAGNOSIS — J301 Allergic rhinitis due to pollen: Secondary | ICD-10-CM | POA: Diagnosis not present

## 2016-07-27 DIAGNOSIS — N950 Postmenopausal bleeding: Secondary | ICD-10-CM | POA: Diagnosis not present

## 2016-07-29 DIAGNOSIS — J3081 Allergic rhinitis due to animal (cat) (dog) hair and dander: Secondary | ICD-10-CM | POA: Diagnosis not present

## 2016-07-29 DIAGNOSIS — J301 Allergic rhinitis due to pollen: Secondary | ICD-10-CM | POA: Diagnosis not present

## 2016-07-29 DIAGNOSIS — J3089 Other allergic rhinitis: Secondary | ICD-10-CM | POA: Diagnosis not present

## 2016-08-11 DIAGNOSIS — J3081 Allergic rhinitis due to animal (cat) (dog) hair and dander: Secondary | ICD-10-CM | POA: Diagnosis not present

## 2016-08-11 DIAGNOSIS — J3089 Other allergic rhinitis: Secondary | ICD-10-CM | POA: Diagnosis not present

## 2016-08-11 DIAGNOSIS — J301 Allergic rhinitis due to pollen: Secondary | ICD-10-CM | POA: Diagnosis not present

## 2016-08-22 DIAGNOSIS — J3089 Other allergic rhinitis: Secondary | ICD-10-CM | POA: Diagnosis not present

## 2016-08-22 DIAGNOSIS — J301 Allergic rhinitis due to pollen: Secondary | ICD-10-CM | POA: Diagnosis not present

## 2016-08-22 DIAGNOSIS — J3081 Allergic rhinitis due to animal (cat) (dog) hair and dander: Secondary | ICD-10-CM | POA: Diagnosis not present

## 2016-08-24 DIAGNOSIS — J3089 Other allergic rhinitis: Secondary | ICD-10-CM | POA: Diagnosis not present

## 2016-08-24 DIAGNOSIS — J301 Allergic rhinitis due to pollen: Secondary | ICD-10-CM | POA: Diagnosis not present

## 2016-08-24 DIAGNOSIS — J3081 Allergic rhinitis due to animal (cat) (dog) hair and dander: Secondary | ICD-10-CM | POA: Diagnosis not present

## 2016-09-07 DIAGNOSIS — M25521 Pain in right elbow: Secondary | ICD-10-CM | POA: Diagnosis not present

## 2016-09-07 DIAGNOSIS — M79672 Pain in left foot: Secondary | ICD-10-CM | POA: Diagnosis not present

## 2016-09-13 DIAGNOSIS — M25521 Pain in right elbow: Secondary | ICD-10-CM | POA: Diagnosis not present

## 2016-09-13 DIAGNOSIS — J301 Allergic rhinitis due to pollen: Secondary | ICD-10-CM | POA: Diagnosis not present

## 2016-09-13 DIAGNOSIS — M79672 Pain in left foot: Secondary | ICD-10-CM | POA: Diagnosis not present

## 2016-09-15 DIAGNOSIS — M25521 Pain in right elbow: Secondary | ICD-10-CM | POA: Diagnosis not present

## 2016-09-15 DIAGNOSIS — J301 Allergic rhinitis due to pollen: Secondary | ICD-10-CM | POA: Diagnosis not present

## 2016-09-15 DIAGNOSIS — M79672 Pain in left foot: Secondary | ICD-10-CM | POA: Diagnosis not present

## 2016-09-19 DIAGNOSIS — J3081 Allergic rhinitis due to animal (cat) (dog) hair and dander: Secondary | ICD-10-CM | POA: Diagnosis not present

## 2016-09-19 DIAGNOSIS — J3089 Other allergic rhinitis: Secondary | ICD-10-CM | POA: Diagnosis not present

## 2016-09-19 DIAGNOSIS — J301 Allergic rhinitis due to pollen: Secondary | ICD-10-CM | POA: Diagnosis not present

## 2016-09-20 DIAGNOSIS — M79672 Pain in left foot: Secondary | ICD-10-CM | POA: Diagnosis not present

## 2016-09-20 DIAGNOSIS — M25521 Pain in right elbow: Secondary | ICD-10-CM | POA: Diagnosis not present

## 2016-09-22 DIAGNOSIS — M25521 Pain in right elbow: Secondary | ICD-10-CM | POA: Diagnosis not present

## 2016-09-22 DIAGNOSIS — M79672 Pain in left foot: Secondary | ICD-10-CM | POA: Diagnosis not present

## 2016-09-27 DIAGNOSIS — M25521 Pain in right elbow: Secondary | ICD-10-CM | POA: Diagnosis not present

## 2016-09-27 DIAGNOSIS — M79672 Pain in left foot: Secondary | ICD-10-CM | POA: Diagnosis not present

## 2016-09-29 DIAGNOSIS — M79672 Pain in left foot: Secondary | ICD-10-CM | POA: Diagnosis not present

## 2016-09-29 DIAGNOSIS — M25521 Pain in right elbow: Secondary | ICD-10-CM | POA: Diagnosis not present

## 2016-10-03 DIAGNOSIS — M79672 Pain in left foot: Secondary | ICD-10-CM | POA: Diagnosis not present

## 2016-10-03 DIAGNOSIS — M25521 Pain in right elbow: Secondary | ICD-10-CM | POA: Diagnosis not present

## 2016-10-06 DIAGNOSIS — M79672 Pain in left foot: Secondary | ICD-10-CM | POA: Diagnosis not present

## 2016-10-06 DIAGNOSIS — M25521 Pain in right elbow: Secondary | ICD-10-CM | POA: Diagnosis not present

## 2016-10-10 DIAGNOSIS — M25521 Pain in right elbow: Secondary | ICD-10-CM | POA: Diagnosis not present

## 2016-10-10 DIAGNOSIS — M79672 Pain in left foot: Secondary | ICD-10-CM | POA: Diagnosis not present

## 2016-10-11 DIAGNOSIS — J301 Allergic rhinitis due to pollen: Secondary | ICD-10-CM | POA: Diagnosis not present

## 2016-10-11 DIAGNOSIS — J3081 Allergic rhinitis due to animal (cat) (dog) hair and dander: Secondary | ICD-10-CM | POA: Diagnosis not present

## 2016-10-11 DIAGNOSIS — J3089 Other allergic rhinitis: Secondary | ICD-10-CM | POA: Diagnosis not present

## 2016-11-14 DIAGNOSIS — J3081 Allergic rhinitis due to animal (cat) (dog) hair and dander: Secondary | ICD-10-CM | POA: Diagnosis not present

## 2016-11-14 DIAGNOSIS — J301 Allergic rhinitis due to pollen: Secondary | ICD-10-CM | POA: Diagnosis not present

## 2016-11-14 DIAGNOSIS — J3089 Other allergic rhinitis: Secondary | ICD-10-CM | POA: Diagnosis not present

## 2016-11-17 DIAGNOSIS — M25521 Pain in right elbow: Secondary | ICD-10-CM | POA: Diagnosis not present

## 2016-11-17 DIAGNOSIS — M79672 Pain in left foot: Secondary | ICD-10-CM | POA: Diagnosis not present

## 2016-11-22 DIAGNOSIS — M79672 Pain in left foot: Secondary | ICD-10-CM | POA: Diagnosis not present

## 2016-11-22 DIAGNOSIS — M25521 Pain in right elbow: Secondary | ICD-10-CM | POA: Diagnosis not present

## 2016-12-12 DIAGNOSIS — J301 Allergic rhinitis due to pollen: Secondary | ICD-10-CM | POA: Diagnosis not present

## 2016-12-12 DIAGNOSIS — J3089 Other allergic rhinitis: Secondary | ICD-10-CM | POA: Diagnosis not present

## 2016-12-12 DIAGNOSIS — J3081 Allergic rhinitis due to animal (cat) (dog) hair and dander: Secondary | ICD-10-CM | POA: Diagnosis not present

## 2017-01-24 DIAGNOSIS — J3081 Allergic rhinitis due to animal (cat) (dog) hair and dander: Secondary | ICD-10-CM | POA: Diagnosis not present

## 2017-01-24 DIAGNOSIS — J3089 Other allergic rhinitis: Secondary | ICD-10-CM | POA: Diagnosis not present

## 2017-01-24 DIAGNOSIS — J301 Allergic rhinitis due to pollen: Secondary | ICD-10-CM | POA: Diagnosis not present

## 2017-01-25 DIAGNOSIS — Z01419 Encounter for gynecological examination (general) (routine) without abnormal findings: Secondary | ICD-10-CM | POA: Diagnosis not present

## 2017-01-25 DIAGNOSIS — Z1231 Encounter for screening mammogram for malignant neoplasm of breast: Secondary | ICD-10-CM | POA: Diagnosis not present

## 2017-01-25 DIAGNOSIS — Z6823 Body mass index (BMI) 23.0-23.9, adult: Secondary | ICD-10-CM | POA: Diagnosis not present

## 2017-01-25 DIAGNOSIS — Z1212 Encounter for screening for malignant neoplasm of rectum: Secondary | ICD-10-CM | POA: Diagnosis not present

## 2017-02-13 DIAGNOSIS — J301 Allergic rhinitis due to pollen: Secondary | ICD-10-CM | POA: Diagnosis not present

## 2017-02-13 DIAGNOSIS — J3089 Other allergic rhinitis: Secondary | ICD-10-CM | POA: Diagnosis not present

## 2017-02-23 DIAGNOSIS — M25562 Pain in left knee: Secondary | ICD-10-CM | POA: Diagnosis not present

## 2017-02-23 DIAGNOSIS — M25561 Pain in right knee: Secondary | ICD-10-CM | POA: Diagnosis not present

## 2017-03-08 DIAGNOSIS — J3089 Other allergic rhinitis: Secondary | ICD-10-CM | POA: Diagnosis not present

## 2017-03-08 DIAGNOSIS — J3081 Allergic rhinitis due to animal (cat) (dog) hair and dander: Secondary | ICD-10-CM | POA: Diagnosis not present

## 2017-03-08 DIAGNOSIS — J301 Allergic rhinitis due to pollen: Secondary | ICD-10-CM | POA: Diagnosis not present

## 2017-03-17 DIAGNOSIS — M222X1 Patellofemoral disorders, right knee: Secondary | ICD-10-CM | POA: Diagnosis not present

## 2017-03-17 DIAGNOSIS — M722 Plantar fascial fibromatosis: Secondary | ICD-10-CM | POA: Diagnosis not present

## 2017-03-17 DIAGNOSIS — M7711 Lateral epicondylitis, right elbow: Secondary | ICD-10-CM | POA: Diagnosis not present

## 2017-03-21 DIAGNOSIS — Z136 Encounter for screening for cardiovascular disorders: Secondary | ICD-10-CM | POA: Diagnosis not present

## 2017-03-21 DIAGNOSIS — Z0000 Encounter for general adult medical examination without abnormal findings: Secondary | ICD-10-CM | POA: Diagnosis not present

## 2017-03-21 DIAGNOSIS — J301 Allergic rhinitis due to pollen: Secondary | ICD-10-CM | POA: Diagnosis not present

## 2017-03-21 DIAGNOSIS — K219 Gastro-esophageal reflux disease without esophagitis: Secondary | ICD-10-CM | POA: Diagnosis not present

## 2017-03-21 DIAGNOSIS — Z131 Encounter for screening for diabetes mellitus: Secondary | ICD-10-CM | POA: Diagnosis not present

## 2017-03-22 DIAGNOSIS — M7711 Lateral epicondylitis, right elbow: Secondary | ICD-10-CM | POA: Diagnosis not present

## 2017-03-22 DIAGNOSIS — M722 Plantar fascial fibromatosis: Secondary | ICD-10-CM | POA: Diagnosis not present

## 2017-03-22 DIAGNOSIS — M222X1 Patellofemoral disorders, right knee: Secondary | ICD-10-CM | POA: Diagnosis not present

## 2017-03-29 DIAGNOSIS — M222X1 Patellofemoral disorders, right knee: Secondary | ICD-10-CM | POA: Diagnosis not present

## 2017-03-29 DIAGNOSIS — M7711 Lateral epicondylitis, right elbow: Secondary | ICD-10-CM | POA: Diagnosis not present

## 2017-03-29 DIAGNOSIS — M722 Plantar fascial fibromatosis: Secondary | ICD-10-CM | POA: Diagnosis not present

## 2017-04-04 DIAGNOSIS — M7711 Lateral epicondylitis, right elbow: Secondary | ICD-10-CM | POA: Diagnosis not present

## 2017-04-04 DIAGNOSIS — M722 Plantar fascial fibromatosis: Secondary | ICD-10-CM | POA: Diagnosis not present

## 2017-04-04 DIAGNOSIS — M222X1 Patellofemoral disorders, right knee: Secondary | ICD-10-CM | POA: Diagnosis not present

## 2017-04-10 DIAGNOSIS — J301 Allergic rhinitis due to pollen: Secondary | ICD-10-CM | POA: Diagnosis not present

## 2017-04-10 DIAGNOSIS — J3081 Allergic rhinitis due to animal (cat) (dog) hair and dander: Secondary | ICD-10-CM | POA: Diagnosis not present

## 2017-04-10 DIAGNOSIS — J3089 Other allergic rhinitis: Secondary | ICD-10-CM | POA: Diagnosis not present

## 2017-04-12 DIAGNOSIS — M722 Plantar fascial fibromatosis: Secondary | ICD-10-CM | POA: Diagnosis not present

## 2017-04-12 DIAGNOSIS — M7711 Lateral epicondylitis, right elbow: Secondary | ICD-10-CM | POA: Diagnosis not present

## 2017-04-12 DIAGNOSIS — M222X1 Patellofemoral disorders, right knee: Secondary | ICD-10-CM | POA: Diagnosis not present

## 2017-04-25 DIAGNOSIS — J398 Other specified diseases of upper respiratory tract: Secondary | ICD-10-CM | POA: Diagnosis not present

## 2017-04-26 DIAGNOSIS — M722 Plantar fascial fibromatosis: Secondary | ICD-10-CM | POA: Diagnosis not present

## 2017-04-26 DIAGNOSIS — M7711 Lateral epicondylitis, right elbow: Secondary | ICD-10-CM | POA: Diagnosis not present

## 2017-04-26 DIAGNOSIS — M222X1 Patellofemoral disorders, right knee: Secondary | ICD-10-CM | POA: Diagnosis not present

## 2017-05-04 DIAGNOSIS — M7711 Lateral epicondylitis, right elbow: Secondary | ICD-10-CM | POA: Diagnosis not present

## 2017-05-04 DIAGNOSIS — M222X1 Patellofemoral disorders, right knee: Secondary | ICD-10-CM | POA: Diagnosis not present

## 2017-05-04 DIAGNOSIS — M722 Plantar fascial fibromatosis: Secondary | ICD-10-CM | POA: Diagnosis not present

## 2017-05-18 DIAGNOSIS — M222X1 Patellofemoral disorders, right knee: Secondary | ICD-10-CM | POA: Diagnosis not present

## 2017-05-18 DIAGNOSIS — M7711 Lateral epicondylitis, right elbow: Secondary | ICD-10-CM | POA: Diagnosis not present

## 2017-05-18 DIAGNOSIS — M722 Plantar fascial fibromatosis: Secondary | ICD-10-CM | POA: Diagnosis not present

## 2017-05-24 DIAGNOSIS — J3081 Allergic rhinitis due to animal (cat) (dog) hair and dander: Secondary | ICD-10-CM | POA: Diagnosis not present

## 2017-05-24 DIAGNOSIS — J301 Allergic rhinitis due to pollen: Secondary | ICD-10-CM | POA: Diagnosis not present

## 2017-05-24 DIAGNOSIS — J3089 Other allergic rhinitis: Secondary | ICD-10-CM | POA: Diagnosis not present

## 2017-07-06 DIAGNOSIS — J3089 Other allergic rhinitis: Secondary | ICD-10-CM | POA: Diagnosis not present

## 2017-07-06 DIAGNOSIS — J3081 Allergic rhinitis due to animal (cat) (dog) hair and dander: Secondary | ICD-10-CM | POA: Diagnosis not present

## 2017-07-06 DIAGNOSIS — J301 Allergic rhinitis due to pollen: Secondary | ICD-10-CM | POA: Diagnosis not present

## 2017-07-07 DIAGNOSIS — J398 Other specified diseases of upper respiratory tract: Secondary | ICD-10-CM | POA: Diagnosis not present

## 2017-07-07 DIAGNOSIS — J386 Stenosis of larynx: Secondary | ICD-10-CM | POA: Diagnosis not present

## 2017-07-31 DIAGNOSIS — J301 Allergic rhinitis due to pollen: Secondary | ICD-10-CM | POA: Diagnosis not present

## 2017-07-31 DIAGNOSIS — J3081 Allergic rhinitis due to animal (cat) (dog) hair and dander: Secondary | ICD-10-CM | POA: Diagnosis not present

## 2017-07-31 DIAGNOSIS — J3089 Other allergic rhinitis: Secondary | ICD-10-CM | POA: Diagnosis not present

## 2017-08-02 DIAGNOSIS — J3081 Allergic rhinitis due to animal (cat) (dog) hair and dander: Secondary | ICD-10-CM | POA: Diagnosis not present

## 2017-08-02 DIAGNOSIS — J3089 Other allergic rhinitis: Secondary | ICD-10-CM | POA: Diagnosis not present

## 2017-08-02 DIAGNOSIS — J301 Allergic rhinitis due to pollen: Secondary | ICD-10-CM | POA: Diagnosis not present

## 2017-08-11 DIAGNOSIS — J3089 Other allergic rhinitis: Secondary | ICD-10-CM | POA: Diagnosis not present

## 2017-08-11 DIAGNOSIS — J3081 Allergic rhinitis due to animal (cat) (dog) hair and dander: Secondary | ICD-10-CM | POA: Diagnosis not present

## 2017-08-11 DIAGNOSIS — J301 Allergic rhinitis due to pollen: Secondary | ICD-10-CM | POA: Diagnosis not present

## 2017-08-21 DIAGNOSIS — J3081 Allergic rhinitis due to animal (cat) (dog) hair and dander: Secondary | ICD-10-CM | POA: Diagnosis not present

## 2017-08-21 DIAGNOSIS — J301 Allergic rhinitis due to pollen: Secondary | ICD-10-CM | POA: Diagnosis not present

## 2017-08-21 DIAGNOSIS — J3089 Other allergic rhinitis: Secondary | ICD-10-CM | POA: Diagnosis not present

## 2017-08-23 DIAGNOSIS — J3081 Allergic rhinitis due to animal (cat) (dog) hair and dander: Secondary | ICD-10-CM | POA: Diagnosis not present

## 2017-08-23 DIAGNOSIS — J3089 Other allergic rhinitis: Secondary | ICD-10-CM | POA: Diagnosis not present

## 2017-08-23 DIAGNOSIS — J301 Allergic rhinitis due to pollen: Secondary | ICD-10-CM | POA: Diagnosis not present

## 2017-08-28 DIAGNOSIS — J3089 Other allergic rhinitis: Secondary | ICD-10-CM | POA: Diagnosis not present

## 2017-08-28 DIAGNOSIS — J301 Allergic rhinitis due to pollen: Secondary | ICD-10-CM | POA: Diagnosis not present

## 2017-08-28 DIAGNOSIS — J3081 Allergic rhinitis due to animal (cat) (dog) hair and dander: Secondary | ICD-10-CM | POA: Diagnosis not present

## 2017-08-31 DIAGNOSIS — J3089 Other allergic rhinitis: Secondary | ICD-10-CM | POA: Diagnosis not present

## 2017-08-31 DIAGNOSIS — J301 Allergic rhinitis due to pollen: Secondary | ICD-10-CM | POA: Diagnosis not present

## 2017-08-31 DIAGNOSIS — J3081 Allergic rhinitis due to animal (cat) (dog) hair and dander: Secondary | ICD-10-CM | POA: Diagnosis not present

## 2017-10-02 DIAGNOSIS — J3089 Other allergic rhinitis: Secondary | ICD-10-CM | POA: Diagnosis not present

## 2017-10-02 DIAGNOSIS — J301 Allergic rhinitis due to pollen: Secondary | ICD-10-CM | POA: Diagnosis not present

## 2017-10-02 DIAGNOSIS — J3081 Allergic rhinitis due to animal (cat) (dog) hair and dander: Secondary | ICD-10-CM | POA: Diagnosis not present

## 2017-10-06 DIAGNOSIS — M545 Low back pain: Secondary | ICD-10-CM | POA: Diagnosis not present

## 2017-10-11 DIAGNOSIS — M545 Low back pain: Secondary | ICD-10-CM | POA: Diagnosis not present

## 2017-10-18 DIAGNOSIS — M545 Low back pain: Secondary | ICD-10-CM | POA: Diagnosis not present

## 2017-11-01 DIAGNOSIS — J3081 Allergic rhinitis due to animal (cat) (dog) hair and dander: Secondary | ICD-10-CM | POA: Diagnosis not present

## 2017-11-01 DIAGNOSIS — J301 Allergic rhinitis due to pollen: Secondary | ICD-10-CM | POA: Diagnosis not present

## 2017-11-01 DIAGNOSIS — J3089 Other allergic rhinitis: Secondary | ICD-10-CM | POA: Diagnosis not present

## 2017-12-01 DIAGNOSIS — J3089 Other allergic rhinitis: Secondary | ICD-10-CM | POA: Diagnosis not present

## 2017-12-01 DIAGNOSIS — J3081 Allergic rhinitis due to animal (cat) (dog) hair and dander: Secondary | ICD-10-CM | POA: Diagnosis not present

## 2017-12-01 DIAGNOSIS — J301 Allergic rhinitis due to pollen: Secondary | ICD-10-CM | POA: Diagnosis not present

## 2017-12-28 DIAGNOSIS — J301 Allergic rhinitis due to pollen: Secondary | ICD-10-CM | POA: Diagnosis not present

## 2017-12-28 DIAGNOSIS — J3089 Other allergic rhinitis: Secondary | ICD-10-CM | POA: Diagnosis not present

## 2018-01-31 DIAGNOSIS — J301 Allergic rhinitis due to pollen: Secondary | ICD-10-CM | POA: Diagnosis not present

## 2018-01-31 DIAGNOSIS — J3089 Other allergic rhinitis: Secondary | ICD-10-CM | POA: Diagnosis not present

## 2018-01-31 DIAGNOSIS — J3081 Allergic rhinitis due to animal (cat) (dog) hair and dander: Secondary | ICD-10-CM | POA: Diagnosis not present

## 2018-02-07 DIAGNOSIS — J301 Allergic rhinitis due to pollen: Secondary | ICD-10-CM | POA: Diagnosis not present

## 2018-02-08 DIAGNOSIS — J3089 Other allergic rhinitis: Secondary | ICD-10-CM | POA: Diagnosis not present

## 2018-02-08 DIAGNOSIS — J3081 Allergic rhinitis due to animal (cat) (dog) hair and dander: Secondary | ICD-10-CM | POA: Diagnosis not present

## 2018-02-14 DIAGNOSIS — Z6823 Body mass index (BMI) 23.0-23.9, adult: Secondary | ICD-10-CM | POA: Diagnosis not present

## 2018-02-14 DIAGNOSIS — Z1231 Encounter for screening mammogram for malignant neoplasm of breast: Secondary | ICD-10-CM | POA: Diagnosis not present

## 2018-02-14 DIAGNOSIS — Z1212 Encounter for screening for malignant neoplasm of rectum: Secondary | ICD-10-CM | POA: Diagnosis not present

## 2018-02-14 DIAGNOSIS — Z01419 Encounter for gynecological examination (general) (routine) without abnormal findings: Secondary | ICD-10-CM | POA: Diagnosis not present

## 2018-02-26 DIAGNOSIS — J301 Allergic rhinitis due to pollen: Secondary | ICD-10-CM | POA: Diagnosis not present

## 2018-02-26 DIAGNOSIS — J3081 Allergic rhinitis due to animal (cat) (dog) hair and dander: Secondary | ICD-10-CM | POA: Diagnosis not present

## 2018-02-26 DIAGNOSIS — J3089 Other allergic rhinitis: Secondary | ICD-10-CM | POA: Diagnosis not present

## 2018-03-26 DIAGNOSIS — J3081 Allergic rhinitis due to animal (cat) (dog) hair and dander: Secondary | ICD-10-CM | POA: Diagnosis not present

## 2018-03-26 DIAGNOSIS — J301 Allergic rhinitis due to pollen: Secondary | ICD-10-CM | POA: Diagnosis not present

## 2018-03-26 DIAGNOSIS — J3089 Other allergic rhinitis: Secondary | ICD-10-CM | POA: Diagnosis not present

## 2018-03-30 DIAGNOSIS — J301 Allergic rhinitis due to pollen: Secondary | ICD-10-CM | POA: Diagnosis not present

## 2018-03-30 DIAGNOSIS — J3089 Other allergic rhinitis: Secondary | ICD-10-CM | POA: Diagnosis not present

## 2018-03-30 DIAGNOSIS — J3081 Allergic rhinitis due to animal (cat) (dog) hair and dander: Secondary | ICD-10-CM | POA: Diagnosis not present

## 2018-04-03 DIAGNOSIS — J301 Allergic rhinitis due to pollen: Secondary | ICD-10-CM | POA: Diagnosis not present

## 2018-04-03 DIAGNOSIS — J3081 Allergic rhinitis due to animal (cat) (dog) hair and dander: Secondary | ICD-10-CM | POA: Diagnosis not present

## 2018-04-03 DIAGNOSIS — J3089 Other allergic rhinitis: Secondary | ICD-10-CM | POA: Diagnosis not present

## 2018-04-09 DIAGNOSIS — J3089 Other allergic rhinitis: Secondary | ICD-10-CM | POA: Diagnosis not present

## 2018-04-09 DIAGNOSIS — J301 Allergic rhinitis due to pollen: Secondary | ICD-10-CM | POA: Diagnosis not present

## 2018-04-09 DIAGNOSIS — J3081 Allergic rhinitis due to animal (cat) (dog) hair and dander: Secondary | ICD-10-CM | POA: Diagnosis not present

## 2018-04-12 DIAGNOSIS — J3089 Other allergic rhinitis: Secondary | ICD-10-CM | POA: Diagnosis not present

## 2018-04-12 DIAGNOSIS — J301 Allergic rhinitis due to pollen: Secondary | ICD-10-CM | POA: Diagnosis not present

## 2018-04-12 DIAGNOSIS — J3081 Allergic rhinitis due to animal (cat) (dog) hair and dander: Secondary | ICD-10-CM | POA: Diagnosis not present

## 2018-04-27 DIAGNOSIS — Z131 Encounter for screening for diabetes mellitus: Secondary | ICD-10-CM | POA: Diagnosis not present

## 2018-04-27 DIAGNOSIS — Z13220 Encounter for screening for lipoid disorders: Secondary | ICD-10-CM | POA: Diagnosis not present

## 2018-04-27 DIAGNOSIS — Z0000 Encounter for general adult medical examination without abnormal findings: Secondary | ICD-10-CM | POA: Diagnosis not present

## 2018-05-02 DIAGNOSIS — J301 Allergic rhinitis due to pollen: Secondary | ICD-10-CM | POA: Diagnosis not present

## 2018-05-02 DIAGNOSIS — K219 Gastro-esophageal reflux disease without esophagitis: Secondary | ICD-10-CM | POA: Diagnosis not present

## 2018-05-02 DIAGNOSIS — J3081 Allergic rhinitis due to animal (cat) (dog) hair and dander: Secondary | ICD-10-CM | POA: Diagnosis not present

## 2018-05-02 DIAGNOSIS — J3089 Other allergic rhinitis: Secondary | ICD-10-CM | POA: Diagnosis not present

## 2018-05-02 DIAGNOSIS — N951 Menopausal and female climacteric states: Secondary | ICD-10-CM | POA: Diagnosis not present

## 2018-05-08 DIAGNOSIS — J3081 Allergic rhinitis due to animal (cat) (dog) hair and dander: Secondary | ICD-10-CM | POA: Diagnosis not present

## 2018-05-08 DIAGNOSIS — J3089 Other allergic rhinitis: Secondary | ICD-10-CM | POA: Diagnosis not present

## 2018-05-08 DIAGNOSIS — J301 Allergic rhinitis due to pollen: Secondary | ICD-10-CM | POA: Diagnosis not present

## 2019-01-28 DIAGNOSIS — J3089 Other allergic rhinitis: Secondary | ICD-10-CM | POA: Diagnosis not present

## 2019-01-28 DIAGNOSIS — J3081 Allergic rhinitis due to animal (cat) (dog) hair and dander: Secondary | ICD-10-CM | POA: Diagnosis not present

## 2019-01-28 DIAGNOSIS — J301 Allergic rhinitis due to pollen: Secondary | ICD-10-CM | POA: Diagnosis not present

## 2019-02-08 ENCOUNTER — Ambulatory Visit: Payer: BC Managed Care – PPO | Attending: Internal Medicine

## 2019-02-08 DIAGNOSIS — Z20822 Contact with and (suspected) exposure to covid-19: Secondary | ICD-10-CM

## 2019-02-09 LAB — NOVEL CORONAVIRUS, NAA: SARS-CoV-2, NAA: NOT DETECTED

## 2019-02-27 DIAGNOSIS — J301 Allergic rhinitis due to pollen: Secondary | ICD-10-CM | POA: Diagnosis not present

## 2019-02-27 DIAGNOSIS — J3081 Allergic rhinitis due to animal (cat) (dog) hair and dander: Secondary | ICD-10-CM | POA: Diagnosis not present

## 2019-02-27 DIAGNOSIS — J3089 Other allergic rhinitis: Secondary | ICD-10-CM | POA: Diagnosis not present

## 2019-03-01 DIAGNOSIS — J301 Allergic rhinitis due to pollen: Secondary | ICD-10-CM | POA: Diagnosis not present

## 2019-03-04 DIAGNOSIS — J3089 Other allergic rhinitis: Secondary | ICD-10-CM | POA: Diagnosis not present

## 2019-03-04 DIAGNOSIS — J3081 Allergic rhinitis due to animal (cat) (dog) hair and dander: Secondary | ICD-10-CM | POA: Diagnosis not present

## 2019-03-05 DIAGNOSIS — J3081 Allergic rhinitis due to animal (cat) (dog) hair and dander: Secondary | ICD-10-CM | POA: Diagnosis not present

## 2019-03-05 DIAGNOSIS — J301 Allergic rhinitis due to pollen: Secondary | ICD-10-CM | POA: Diagnosis not present

## 2019-03-05 DIAGNOSIS — J3089 Other allergic rhinitis: Secondary | ICD-10-CM | POA: Diagnosis not present

## 2019-03-08 DIAGNOSIS — J3089 Other allergic rhinitis: Secondary | ICD-10-CM | POA: Diagnosis not present

## 2019-03-08 DIAGNOSIS — J3081 Allergic rhinitis due to animal (cat) (dog) hair and dander: Secondary | ICD-10-CM | POA: Diagnosis not present

## 2019-03-08 DIAGNOSIS — J301 Allergic rhinitis due to pollen: Secondary | ICD-10-CM | POA: Diagnosis not present

## 2019-03-12 DIAGNOSIS — J3089 Other allergic rhinitis: Secondary | ICD-10-CM | POA: Diagnosis not present

## 2019-03-12 DIAGNOSIS — J3081 Allergic rhinitis due to animal (cat) (dog) hair and dander: Secondary | ICD-10-CM | POA: Diagnosis not present

## 2019-03-12 DIAGNOSIS — J301 Allergic rhinitis due to pollen: Secondary | ICD-10-CM | POA: Diagnosis not present

## 2019-03-19 DIAGNOSIS — J301 Allergic rhinitis due to pollen: Secondary | ICD-10-CM | POA: Diagnosis not present

## 2019-03-19 DIAGNOSIS — J3089 Other allergic rhinitis: Secondary | ICD-10-CM | POA: Diagnosis not present

## 2019-03-19 DIAGNOSIS — J3081 Allergic rhinitis due to animal (cat) (dog) hair and dander: Secondary | ICD-10-CM | POA: Diagnosis not present

## 2019-04-02 DIAGNOSIS — J3081 Allergic rhinitis due to animal (cat) (dog) hair and dander: Secondary | ICD-10-CM | POA: Diagnosis not present

## 2019-04-02 DIAGNOSIS — J301 Allergic rhinitis due to pollen: Secondary | ICD-10-CM | POA: Diagnosis not present

## 2019-04-02 DIAGNOSIS — J3089 Other allergic rhinitis: Secondary | ICD-10-CM | POA: Diagnosis not present

## 2019-04-04 DIAGNOSIS — J3081 Allergic rhinitis due to animal (cat) (dog) hair and dander: Secondary | ICD-10-CM | POA: Diagnosis not present

## 2019-04-04 DIAGNOSIS — J3089 Other allergic rhinitis: Secondary | ICD-10-CM | POA: Diagnosis not present

## 2019-04-04 DIAGNOSIS — J301 Allergic rhinitis due to pollen: Secondary | ICD-10-CM | POA: Diagnosis not present

## 2019-04-11 DIAGNOSIS — Z13220 Encounter for screening for lipoid disorders: Secondary | ICD-10-CM | POA: Diagnosis not present

## 2019-04-11 DIAGNOSIS — K219 Gastro-esophageal reflux disease without esophagitis: Secondary | ICD-10-CM | POA: Diagnosis not present

## 2019-04-11 DIAGNOSIS — Z0000 Encounter for general adult medical examination without abnormal findings: Secondary | ICD-10-CM | POA: Diagnosis not present

## 2019-05-03 DIAGNOSIS — J3081 Allergic rhinitis due to animal (cat) (dog) hair and dander: Secondary | ICD-10-CM | POA: Diagnosis not present

## 2019-05-03 DIAGNOSIS — J301 Allergic rhinitis due to pollen: Secondary | ICD-10-CM | POA: Diagnosis not present

## 2019-05-03 DIAGNOSIS — J3089 Other allergic rhinitis: Secondary | ICD-10-CM | POA: Diagnosis not present

## 2019-05-13 DIAGNOSIS — H1045 Other chronic allergic conjunctivitis: Secondary | ICD-10-CM | POA: Diagnosis not present

## 2019-05-13 DIAGNOSIS — J3089 Other allergic rhinitis: Secondary | ICD-10-CM | POA: Diagnosis not present

## 2019-05-13 DIAGNOSIS — J301 Allergic rhinitis due to pollen: Secondary | ICD-10-CM | POA: Diagnosis not present

## 2019-05-13 DIAGNOSIS — J3081 Allergic rhinitis due to animal (cat) (dog) hair and dander: Secondary | ICD-10-CM | POA: Diagnosis not present

## 2019-05-23 DIAGNOSIS — R6882 Decreased libido: Secondary | ICD-10-CM | POA: Diagnosis not present

## 2019-05-23 DIAGNOSIS — Z808 Family history of malignant neoplasm of other organs or systems: Secondary | ICD-10-CM | POA: Diagnosis not present

## 2019-05-23 DIAGNOSIS — Z801 Family history of malignant neoplasm of trachea, bronchus and lung: Secondary | ICD-10-CM | POA: Diagnosis not present

## 2019-05-23 DIAGNOSIS — Z1231 Encounter for screening mammogram for malignant neoplasm of breast: Secondary | ICD-10-CM | POA: Diagnosis not present

## 2019-05-23 DIAGNOSIS — Z01419 Encounter for gynecological examination (general) (routine) without abnormal findings: Secondary | ICD-10-CM | POA: Diagnosis not present

## 2019-05-23 DIAGNOSIS — J3089 Other allergic rhinitis: Secondary | ICD-10-CM | POA: Diagnosis not present

## 2019-05-23 DIAGNOSIS — J3081 Allergic rhinitis due to animal (cat) (dog) hair and dander: Secondary | ICD-10-CM | POA: Diagnosis not present

## 2019-05-23 DIAGNOSIS — Z6822 Body mass index (BMI) 22.0-22.9, adult: Secondary | ICD-10-CM | POA: Diagnosis not present

## 2019-05-23 DIAGNOSIS — J301 Allergic rhinitis due to pollen: Secondary | ICD-10-CM | POA: Diagnosis not present

## 2019-06-06 DIAGNOSIS — M25561 Pain in right knee: Secondary | ICD-10-CM | POA: Diagnosis not present

## 2019-06-13 DIAGNOSIS — J301 Allergic rhinitis due to pollen: Secondary | ICD-10-CM | POA: Diagnosis not present

## 2019-06-13 DIAGNOSIS — J3089 Other allergic rhinitis: Secondary | ICD-10-CM | POA: Diagnosis not present

## 2019-06-13 DIAGNOSIS — J3081 Allergic rhinitis due to animal (cat) (dog) hair and dander: Secondary | ICD-10-CM | POA: Diagnosis not present

## 2019-06-21 DIAGNOSIS — M25562 Pain in left knee: Secondary | ICD-10-CM | POA: Diagnosis not present

## 2019-06-21 DIAGNOSIS — M25561 Pain in right knee: Secondary | ICD-10-CM | POA: Diagnosis not present

## 2019-06-21 DIAGNOSIS — M722 Plantar fascial fibromatosis: Secondary | ICD-10-CM | POA: Diagnosis not present

## 2019-06-24 DIAGNOSIS — J301 Allergic rhinitis due to pollen: Secondary | ICD-10-CM | POA: Diagnosis not present

## 2019-06-24 DIAGNOSIS — J3089 Other allergic rhinitis: Secondary | ICD-10-CM | POA: Diagnosis not present

## 2019-06-24 DIAGNOSIS — J3081 Allergic rhinitis due to animal (cat) (dog) hair and dander: Secondary | ICD-10-CM | POA: Diagnosis not present

## 2019-07-03 DIAGNOSIS — R6882 Decreased libido: Secondary | ICD-10-CM | POA: Diagnosis not present

## 2019-07-03 DIAGNOSIS — Z809 Family history of malignant neoplasm, unspecified: Secondary | ICD-10-CM | POA: Diagnosis not present

## 2019-07-10 DIAGNOSIS — J3089 Other allergic rhinitis: Secondary | ICD-10-CM | POA: Diagnosis not present

## 2019-07-10 DIAGNOSIS — J301 Allergic rhinitis due to pollen: Secondary | ICD-10-CM | POA: Diagnosis not present

## 2019-07-10 DIAGNOSIS — J3081 Allergic rhinitis due to animal (cat) (dog) hair and dander: Secondary | ICD-10-CM | POA: Diagnosis not present

## 2019-08-12 DIAGNOSIS — R7989 Other specified abnormal findings of blood chemistry: Secondary | ICD-10-CM | POA: Diagnosis not present

## 2019-08-12 DIAGNOSIS — N760 Acute vaginitis: Secondary | ICD-10-CM | POA: Diagnosis not present

## 2019-08-13 DIAGNOSIS — J3089 Other allergic rhinitis: Secondary | ICD-10-CM | POA: Diagnosis not present

## 2019-08-13 DIAGNOSIS — J301 Allergic rhinitis due to pollen: Secondary | ICD-10-CM | POA: Diagnosis not present

## 2019-08-13 DIAGNOSIS — J3081 Allergic rhinitis due to animal (cat) (dog) hair and dander: Secondary | ICD-10-CM | POA: Diagnosis not present

## 2019-09-02 DIAGNOSIS — J3089 Other allergic rhinitis: Secondary | ICD-10-CM | POA: Diagnosis not present

## 2019-09-02 DIAGNOSIS — J301 Allergic rhinitis due to pollen: Secondary | ICD-10-CM | POA: Diagnosis not present

## 2019-09-02 DIAGNOSIS — J3081 Allergic rhinitis due to animal (cat) (dog) hair and dander: Secondary | ICD-10-CM | POA: Diagnosis not present

## 2019-09-09 DIAGNOSIS — J301 Allergic rhinitis due to pollen: Secondary | ICD-10-CM | POA: Diagnosis not present

## 2019-09-09 DIAGNOSIS — J3089 Other allergic rhinitis: Secondary | ICD-10-CM | POA: Diagnosis not present

## 2019-09-09 DIAGNOSIS — J3081 Allergic rhinitis due to animal (cat) (dog) hair and dander: Secondary | ICD-10-CM | POA: Diagnosis not present

## 2019-09-23 DIAGNOSIS — J3089 Other allergic rhinitis: Secondary | ICD-10-CM | POA: Diagnosis not present

## 2019-09-23 DIAGNOSIS — J301 Allergic rhinitis due to pollen: Secondary | ICD-10-CM | POA: Diagnosis not present

## 2019-09-23 DIAGNOSIS — J3081 Allergic rhinitis due to animal (cat) (dog) hair and dander: Secondary | ICD-10-CM | POA: Diagnosis not present

## 2019-09-23 DIAGNOSIS — R7989 Other specified abnormal findings of blood chemistry: Secondary | ICD-10-CM | POA: Diagnosis not present

## 2019-09-24 DIAGNOSIS — R0781 Pleurodynia: Secondary | ICD-10-CM | POA: Diagnosis not present

## 2019-10-08 DIAGNOSIS — J3081 Allergic rhinitis due to animal (cat) (dog) hair and dander: Secondary | ICD-10-CM | POA: Diagnosis not present

## 2019-10-08 DIAGNOSIS — J301 Allergic rhinitis due to pollen: Secondary | ICD-10-CM | POA: Diagnosis not present

## 2019-10-08 DIAGNOSIS — J3089 Other allergic rhinitis: Secondary | ICD-10-CM | POA: Diagnosis not present

## 2019-10-22 DIAGNOSIS — J3081 Allergic rhinitis due to animal (cat) (dog) hair and dander: Secondary | ICD-10-CM | POA: Diagnosis not present

## 2019-10-22 DIAGNOSIS — J301 Allergic rhinitis due to pollen: Secondary | ICD-10-CM | POA: Diagnosis not present

## 2019-10-22 DIAGNOSIS — J3089 Other allergic rhinitis: Secondary | ICD-10-CM | POA: Diagnosis not present

## 2019-10-22 DIAGNOSIS — Z23 Encounter for immunization: Secondary | ICD-10-CM | POA: Diagnosis not present

## 2019-11-04 DIAGNOSIS — Z20822 Contact with and (suspected) exposure to covid-19: Secondary | ICD-10-CM | POA: Diagnosis not present

## 2019-11-18 DIAGNOSIS — J3089 Other allergic rhinitis: Secondary | ICD-10-CM | POA: Diagnosis not present

## 2019-11-18 DIAGNOSIS — J3081 Allergic rhinitis due to animal (cat) (dog) hair and dander: Secondary | ICD-10-CM | POA: Diagnosis not present

## 2019-11-18 DIAGNOSIS — J301 Allergic rhinitis due to pollen: Secondary | ICD-10-CM | POA: Diagnosis not present

## 2019-12-02 DIAGNOSIS — J301 Allergic rhinitis due to pollen: Secondary | ICD-10-CM | POA: Diagnosis not present

## 2019-12-02 DIAGNOSIS — J3089 Other allergic rhinitis: Secondary | ICD-10-CM | POA: Diagnosis not present

## 2019-12-02 DIAGNOSIS — J3081 Allergic rhinitis due to animal (cat) (dog) hair and dander: Secondary | ICD-10-CM | POA: Diagnosis not present

## 2019-12-16 DIAGNOSIS — J3089 Other allergic rhinitis: Secondary | ICD-10-CM | POA: Diagnosis not present

## 2019-12-16 DIAGNOSIS — J3081 Allergic rhinitis due to animal (cat) (dog) hair and dander: Secondary | ICD-10-CM | POA: Diagnosis not present

## 2019-12-16 DIAGNOSIS — J301 Allergic rhinitis due to pollen: Secondary | ICD-10-CM | POA: Diagnosis not present

## 2019-12-19 NOTE — H&P (Signed)
Toni Gordon is an 54 y.o. female.   Chief Complaint: Subglottic stenosis  HPI: History of subglottic stenosis, idiopathic, has been about 2-1/2 years since her last procedure.   Past Medical History:  Diagnosis Date  . Anxiety   . Complication of anesthesia   . Depression   . GERD (gastroesophageal reflux disease)   . Multiple allergies   . PONV (postoperative nausea and vomiting)   . Trachea, stenosis     Past Surgical History:  Procedure Laterality Date  . ENDOMETRIAL ABLATION    . HAND SURGERY     left thumb  . MICROLARYNGOSCOPY WITH LASER AND BALLOON DILATION N/A 12/25/2014   Procedure: MICROLARYNGOSCOPY WITH LASER AND BALLOON DILATION;  Surgeon: Izora Gala, MD;  Location: [facility];  Service: ENT;  Laterality: N/A;  . SHOULDER ARTHROSCOPY  1992   lt ligament repair  . WISDOM TOOTH EXTRACTION      No family history on file. Social History:  reports that she has never smoked. She does not have any smokeless tobacco history on file. She reports current alcohol use. She reports that she does not use drugs.  Allergies:  Allergies  Allergen Reactions  . Codeine Nausea And Vomiting    No medications prior to admission.    No results found for this or any previous visit (from the past 48 hour(s)). No results found.  ROS: otherwise negative  There were no vitals taken for this visit.  PHYSICAL EXAM: Overall appearance:  Healthy appearing, in no distress, Mild inspiratory stridor.  Head:  Normocephalic, atraumatic. Ears: External auditory canals are clear; tympanic membranes are intact and the middle ears are free of any effusion. Nose: External nose is healthy in appearance. Internal nasal exam free of any lesions or obstruction. Oral Cavity/pharynx:  There are no mucosal lesions or masses identified. Hypopharynx/Larynx: no signs of any mucosal lesions or masses identified. Vocal cords move normally. Neuro:  No identifiable neurologic  deficits. Neck: No palpable neck masses.  Studies Reviewed: none    Assessment/Plan Subglottic stenosis, proceed with microlaryngoscopy, CO2 laser ablation and balloon dilation.   Izora Gala 12/19/2019, 12:33 PM

## 2019-12-24 ENCOUNTER — Other Ambulatory Visit: Payer: Self-pay

## 2019-12-24 ENCOUNTER — Encounter (HOSPITAL_BASED_OUTPATIENT_CLINIC_OR_DEPARTMENT_OTHER): Payer: Self-pay | Admitting: Otolaryngology

## 2019-12-26 ENCOUNTER — Other Ambulatory Visit (HOSPITAL_COMMUNITY): Payer: BC Managed Care – PPO

## 2019-12-30 ENCOUNTER — Ambulatory Visit (HOSPITAL_BASED_OUTPATIENT_CLINIC_OR_DEPARTMENT_OTHER): Admission: RE | Admit: 2019-12-30 | Payer: BC Managed Care – PPO | Source: Home / Self Care | Admitting: Otolaryngology

## 2019-12-30 DIAGNOSIS — J3089 Other allergic rhinitis: Secondary | ICD-10-CM | POA: Diagnosis not present

## 2019-12-30 DIAGNOSIS — J301 Allergic rhinitis due to pollen: Secondary | ICD-10-CM | POA: Diagnosis not present

## 2019-12-30 DIAGNOSIS — J3081 Allergic rhinitis due to animal (cat) (dog) hair and dander: Secondary | ICD-10-CM | POA: Diagnosis not present

## 2019-12-30 HISTORY — DX: Plantar fascial fibromatosis: M72.2

## 2019-12-30 SURGERY — MICROLARYNGOSCOPY
Anesthesia: General

## 2020-01-02 DIAGNOSIS — M25562 Pain in left knee: Secondary | ICD-10-CM | POA: Diagnosis not present

## 2020-01-02 DIAGNOSIS — M722 Plantar fascial fibromatosis: Secondary | ICD-10-CM | POA: Diagnosis not present

## 2020-01-02 DIAGNOSIS — M25561 Pain in right knee: Secondary | ICD-10-CM | POA: Diagnosis not present

## 2020-01-21 DIAGNOSIS — Y9323 Activity, snow (alpine) (downhill) skiing, snow boarding, sledding, tobogganing and snow tubing: Secondary | ICD-10-CM | POA: Diagnosis not present

## 2020-01-21 DIAGNOSIS — T1490XA Injury, unspecified, initial encounter: Secondary | ICD-10-CM | POA: Diagnosis not present

## 2020-01-21 DIAGNOSIS — M79645 Pain in left finger(s): Secondary | ICD-10-CM | POA: Diagnosis not present

## 2020-01-21 DIAGNOSIS — S63642A Sprain of metacarpophalangeal joint of left thumb, initial encounter: Secondary | ICD-10-CM | POA: Diagnosis not present

## 2020-01-21 DIAGNOSIS — W51XXXA Accidental striking against or bumped into by another person, initial encounter: Secondary | ICD-10-CM | POA: Diagnosis not present

## 2020-01-21 DIAGNOSIS — Y92838 Other recreation area as the place of occurrence of the external cause: Secondary | ICD-10-CM | POA: Diagnosis not present

## 2020-02-10 DIAGNOSIS — M79645 Pain in left finger(s): Secondary | ICD-10-CM | POA: Diagnosis not present

## 2020-02-10 DIAGNOSIS — J3089 Other allergic rhinitis: Secondary | ICD-10-CM | POA: Diagnosis not present

## 2020-02-10 DIAGNOSIS — J301 Allergic rhinitis due to pollen: Secondary | ICD-10-CM | POA: Diagnosis not present

## 2020-02-10 DIAGNOSIS — J3081 Allergic rhinitis due to animal (cat) (dog) hair and dander: Secondary | ICD-10-CM | POA: Diagnosis not present

## 2020-02-10 DIAGNOSIS — M79642 Pain in left hand: Secondary | ICD-10-CM | POA: Diagnosis not present

## 2020-02-11 DIAGNOSIS — M79645 Pain in left finger(s): Secondary | ICD-10-CM | POA: Diagnosis not present

## 2020-02-11 DIAGNOSIS — M79642 Pain in left hand: Secondary | ICD-10-CM | POA: Diagnosis not present

## 2020-02-14 DIAGNOSIS — J301 Allergic rhinitis due to pollen: Secondary | ICD-10-CM | POA: Diagnosis not present

## 2020-02-17 DIAGNOSIS — J3081 Allergic rhinitis due to animal (cat) (dog) hair and dander: Secondary | ICD-10-CM | POA: Diagnosis not present

## 2020-02-17 DIAGNOSIS — J3089 Other allergic rhinitis: Secondary | ICD-10-CM | POA: Diagnosis not present

## 2020-02-24 DIAGNOSIS — M25561 Pain in right knee: Secondary | ICD-10-CM | POA: Diagnosis not present

## 2020-02-24 DIAGNOSIS — M722 Plantar fascial fibromatosis: Secondary | ICD-10-CM | POA: Diagnosis not present

## 2020-02-24 DIAGNOSIS — M25562 Pain in left knee: Secondary | ICD-10-CM | POA: Diagnosis not present

## 2020-02-25 DIAGNOSIS — J301 Allergic rhinitis due to pollen: Secondary | ICD-10-CM | POA: Diagnosis not present

## 2020-02-25 DIAGNOSIS — J3089 Other allergic rhinitis: Secondary | ICD-10-CM | POA: Diagnosis not present

## 2020-02-25 DIAGNOSIS — J3081 Allergic rhinitis due to animal (cat) (dog) hair and dander: Secondary | ICD-10-CM | POA: Diagnosis not present

## 2020-03-02 DIAGNOSIS — Z20822 Contact with and (suspected) exposure to covid-19: Secondary | ICD-10-CM | POA: Diagnosis not present

## 2020-03-02 NOTE — Progress Notes (Signed)
Pt plans to reschedule. I asked her to call [facility] office to make them aware and take her off the schedule.

## 2020-03-05 ENCOUNTER — Other Ambulatory Visit (HOSPITAL_COMMUNITY): Payer: BC Managed Care – PPO

## 2020-03-09 ENCOUNTER — Encounter (HOSPITAL_BASED_OUTPATIENT_CLINIC_OR_DEPARTMENT_OTHER): Admission: RE | Payer: Self-pay | Source: Home / Self Care

## 2020-03-09 ENCOUNTER — Ambulatory Visit (HOSPITAL_BASED_OUTPATIENT_CLINIC_OR_DEPARTMENT_OTHER): Admission: RE | Admit: 2020-03-09 | Payer: BC Managed Care – PPO | Source: Home / Self Care | Admitting: Otolaryngology

## 2020-03-09 DIAGNOSIS — J301 Allergic rhinitis due to pollen: Secondary | ICD-10-CM | POA: Diagnosis not present

## 2020-03-09 DIAGNOSIS — J3089 Other allergic rhinitis: Secondary | ICD-10-CM | POA: Diagnosis not present

## 2020-03-09 DIAGNOSIS — J3081 Allergic rhinitis due to animal (cat) (dog) hair and dander: Secondary | ICD-10-CM | POA: Diagnosis not present

## 2020-03-09 SURGERY — MICROLARYNGOSCOPY
Anesthesia: General

## 2020-03-11 DIAGNOSIS — S5332XD Traumatic rupture of left ulnar collateral ligament, subsequent encounter: Secondary | ICD-10-CM | POA: Diagnosis not present

## 2020-03-11 DIAGNOSIS — J301 Allergic rhinitis due to pollen: Secondary | ICD-10-CM | POA: Diagnosis not present

## 2020-03-11 DIAGNOSIS — J3089 Other allergic rhinitis: Secondary | ICD-10-CM | POA: Diagnosis not present

## 2020-03-11 DIAGNOSIS — J3081 Allergic rhinitis due to animal (cat) (dog) hair and dander: Secondary | ICD-10-CM | POA: Diagnosis not present

## 2020-03-12 DIAGNOSIS — M722 Plantar fascial fibromatosis: Secondary | ICD-10-CM | POA: Diagnosis not present

## 2020-03-12 DIAGNOSIS — M25562 Pain in left knee: Secondary | ICD-10-CM | POA: Diagnosis not present

## 2020-03-12 DIAGNOSIS — M25561 Pain in right knee: Secondary | ICD-10-CM | POA: Diagnosis not present

## 2020-03-16 DIAGNOSIS — J3089 Other allergic rhinitis: Secondary | ICD-10-CM | POA: Diagnosis not present

## 2020-03-16 DIAGNOSIS — J3081 Allergic rhinitis due to animal (cat) (dog) hair and dander: Secondary | ICD-10-CM | POA: Diagnosis not present

## 2020-03-16 DIAGNOSIS — J301 Allergic rhinitis due to pollen: Secondary | ICD-10-CM | POA: Diagnosis not present

## 2020-03-30 DIAGNOSIS — J301 Allergic rhinitis due to pollen: Secondary | ICD-10-CM | POA: Diagnosis not present

## 2020-03-30 DIAGNOSIS — J3089 Other allergic rhinitis: Secondary | ICD-10-CM | POA: Diagnosis not present

## 2020-03-30 DIAGNOSIS — J3081 Allergic rhinitis due to animal (cat) (dog) hair and dander: Secondary | ICD-10-CM | POA: Diagnosis not present

## 2020-04-02 DIAGNOSIS — M722 Plantar fascial fibromatosis: Secondary | ICD-10-CM | POA: Diagnosis not present

## 2020-04-02 DIAGNOSIS — M25562 Pain in left knee: Secondary | ICD-10-CM | POA: Diagnosis not present

## 2020-04-02 DIAGNOSIS — M25561 Pain in right knee: Secondary | ICD-10-CM | POA: Diagnosis not present

## 2020-04-06 DIAGNOSIS — S5332XD Traumatic rupture of left ulnar collateral ligament, subsequent encounter: Secondary | ICD-10-CM | POA: Diagnosis not present

## 2020-04-13 DIAGNOSIS — J3081 Allergic rhinitis due to animal (cat) (dog) hair and dander: Secondary | ICD-10-CM | POA: Diagnosis not present

## 2020-04-13 DIAGNOSIS — J3089 Other allergic rhinitis: Secondary | ICD-10-CM | POA: Diagnosis not present

## 2020-04-13 DIAGNOSIS — J301 Allergic rhinitis due to pollen: Secondary | ICD-10-CM | POA: Diagnosis not present

## 2020-04-20 DIAGNOSIS — R29898 Other symptoms and signs involving the musculoskeletal system: Secondary | ICD-10-CM | POA: Diagnosis not present

## 2020-04-20 DIAGNOSIS — S5332XD Traumatic rupture of left ulnar collateral ligament, subsequent encounter: Secondary | ICD-10-CM | POA: Diagnosis not present

## 2020-04-20 DIAGNOSIS — M25642 Stiffness of left hand, not elsewhere classified: Secondary | ICD-10-CM | POA: Diagnosis not present

## 2020-04-20 DIAGNOSIS — M79645 Pain in left finger(s): Secondary | ICD-10-CM | POA: Diagnosis not present

## 2020-04-23 DIAGNOSIS — Z0000 Encounter for general adult medical examination without abnormal findings: Secondary | ICD-10-CM | POA: Diagnosis not present

## 2020-04-23 DIAGNOSIS — Z131 Encounter for screening for diabetes mellitus: Secondary | ICD-10-CM | POA: Diagnosis not present

## 2020-04-23 DIAGNOSIS — Z13220 Encounter for screening for lipoid disorders: Secondary | ICD-10-CM | POA: Diagnosis not present

## 2020-04-23 DIAGNOSIS — Z23 Encounter for immunization: Secondary | ICD-10-CM | POA: Diagnosis not present

## 2020-04-23 DIAGNOSIS — K219 Gastro-esophageal reflux disease without esophagitis: Secondary | ICD-10-CM | POA: Diagnosis not present

## 2020-04-27 DIAGNOSIS — R29898 Other symptoms and signs involving the musculoskeletal system: Secondary | ICD-10-CM | POA: Diagnosis not present

## 2020-04-27 DIAGNOSIS — J3081 Allergic rhinitis due to animal (cat) (dog) hair and dander: Secondary | ICD-10-CM | POA: Diagnosis not present

## 2020-04-27 DIAGNOSIS — S5332XD Traumatic rupture of left ulnar collateral ligament, subsequent encounter: Secondary | ICD-10-CM | POA: Diagnosis not present

## 2020-04-27 DIAGNOSIS — J3089 Other allergic rhinitis: Secondary | ICD-10-CM | POA: Diagnosis not present

## 2020-04-27 DIAGNOSIS — M79645 Pain in left finger(s): Secondary | ICD-10-CM | POA: Diagnosis not present

## 2020-04-27 DIAGNOSIS — J301 Allergic rhinitis due to pollen: Secondary | ICD-10-CM | POA: Diagnosis not present

## 2020-05-04 DIAGNOSIS — R29898 Other symptoms and signs involving the musculoskeletal system: Secondary | ICD-10-CM | POA: Diagnosis not present

## 2020-05-04 DIAGNOSIS — S5332XD Traumatic rupture of left ulnar collateral ligament, subsequent encounter: Secondary | ICD-10-CM | POA: Diagnosis not present

## 2020-05-04 DIAGNOSIS — M79645 Pain in left finger(s): Secondary | ICD-10-CM | POA: Diagnosis not present

## 2020-05-12 DIAGNOSIS — J301 Allergic rhinitis due to pollen: Secondary | ICD-10-CM | POA: Diagnosis not present

## 2020-05-12 DIAGNOSIS — J3089 Other allergic rhinitis: Secondary | ICD-10-CM | POA: Diagnosis not present

## 2020-05-12 DIAGNOSIS — J3081 Allergic rhinitis due to animal (cat) (dog) hair and dander: Secondary | ICD-10-CM | POA: Diagnosis not present

## 2020-05-13 DIAGNOSIS — J301 Allergic rhinitis due to pollen: Secondary | ICD-10-CM | POA: Diagnosis not present

## 2020-05-13 DIAGNOSIS — J3081 Allergic rhinitis due to animal (cat) (dog) hair and dander: Secondary | ICD-10-CM | POA: Diagnosis not present

## 2020-05-13 DIAGNOSIS — H1045 Other chronic allergic conjunctivitis: Secondary | ICD-10-CM | POA: Diagnosis not present

## 2020-05-13 DIAGNOSIS — J3089 Other allergic rhinitis: Secondary | ICD-10-CM | POA: Diagnosis not present

## 2020-05-20 DIAGNOSIS — R29898 Other symptoms and signs involving the musculoskeletal system: Secondary | ICD-10-CM | POA: Diagnosis not present

## 2020-05-20 DIAGNOSIS — M25642 Stiffness of left hand, not elsewhere classified: Secondary | ICD-10-CM | POA: Diagnosis not present

## 2020-05-20 DIAGNOSIS — S5332XD Traumatic rupture of left ulnar collateral ligament, subsequent encounter: Secondary | ICD-10-CM | POA: Diagnosis not present

## 2020-05-20 DIAGNOSIS — M79645 Pain in left finger(s): Secondary | ICD-10-CM | POA: Diagnosis not present

## 2020-05-25 DIAGNOSIS — J3089 Other allergic rhinitis: Secondary | ICD-10-CM | POA: Diagnosis not present

## 2020-05-25 DIAGNOSIS — J3081 Allergic rhinitis due to animal (cat) (dog) hair and dander: Secondary | ICD-10-CM | POA: Diagnosis not present

## 2020-05-25 DIAGNOSIS — J301 Allergic rhinitis due to pollen: Secondary | ICD-10-CM | POA: Diagnosis not present

## 2020-06-09 DIAGNOSIS — J3081 Allergic rhinitis due to animal (cat) (dog) hair and dander: Secondary | ICD-10-CM | POA: Diagnosis not present

## 2020-06-09 DIAGNOSIS — J3089 Other allergic rhinitis: Secondary | ICD-10-CM | POA: Diagnosis not present

## 2020-06-09 DIAGNOSIS — J301 Allergic rhinitis due to pollen: Secondary | ICD-10-CM | POA: Diagnosis not present

## 2020-06-11 DIAGNOSIS — J3089 Other allergic rhinitis: Secondary | ICD-10-CM | POA: Diagnosis not present

## 2020-06-11 DIAGNOSIS — J301 Allergic rhinitis due to pollen: Secondary | ICD-10-CM | POA: Diagnosis not present

## 2020-06-11 DIAGNOSIS — J3081 Allergic rhinitis due to animal (cat) (dog) hair and dander: Secondary | ICD-10-CM | POA: Diagnosis not present

## 2020-06-22 DIAGNOSIS — S40011A Contusion of right shoulder, initial encounter: Secondary | ICD-10-CM | POA: Diagnosis not present

## 2020-06-24 DIAGNOSIS — J3089 Other allergic rhinitis: Secondary | ICD-10-CM | POA: Diagnosis not present

## 2020-06-24 DIAGNOSIS — J3081 Allergic rhinitis due to animal (cat) (dog) hair and dander: Secondary | ICD-10-CM | POA: Diagnosis not present

## 2020-06-24 DIAGNOSIS — J301 Allergic rhinitis due to pollen: Secondary | ICD-10-CM | POA: Diagnosis not present

## 2020-07-06 DIAGNOSIS — J3081 Allergic rhinitis due to animal (cat) (dog) hair and dander: Secondary | ICD-10-CM | POA: Diagnosis not present

## 2020-07-06 DIAGNOSIS — J3089 Other allergic rhinitis: Secondary | ICD-10-CM | POA: Diagnosis not present

## 2020-07-06 DIAGNOSIS — J301 Allergic rhinitis due to pollen: Secondary | ICD-10-CM | POA: Diagnosis not present

## 2020-07-09 ENCOUNTER — Encounter (HOSPITAL_BASED_OUTPATIENT_CLINIC_OR_DEPARTMENT_OTHER): Payer: Self-pay | Admitting: Otolaryngology

## 2020-07-09 ENCOUNTER — Other Ambulatory Visit: Payer: Self-pay

## 2020-07-16 NOTE — H&P (Signed)
Toni Gordon is an 55 y.o. female.   Chief Complaint: Subglottic stenosis  HPI: History of subglottic stenosis, idiopathic, has been about 2-1/2 years since her last procedure.   Past Medical History:  Diagnosis Date   Anxiety    Complication of anesthesia    Depression    GERD (gastroesophageal reflux disease)    Multiple allergies    Plantar fasciitis    PONV (postoperative nausea and vomiting)    Trachea, stenosis     Past Surgical History:  Procedure Laterality Date   ENDOMETRIAL ABLATION     HAND SURGERY     left thumb   MICROLARYNGOSCOPY WITH LASER AND BALLOON DILATION N/A 12/25/2014   Procedure: MICROLARYNGOSCOPY WITH LASER AND BALLOON DILATION;  Surgeon: Izora Gala, MD;  Location: [facility];  Service: ENT;  Laterality: N/A;   SHOULDER ARTHROSCOPY  1992   lt ligament repair   WISDOM TOOTH EXTRACTION      History reviewed. No pertinent family history. Social History:  reports that she has never smoked. She has never used smokeless tobacco. She reports current alcohol use. She reports that she does not use drugs.  Allergies:  Allergies  Allergen Reactions   Codeine Nausea And Vomiting    No medications prior to admission.    No results found for this or any previous visit (from the past 48 hour(s)). No results found.  ROS: otherwise negative  Height 5' 4.5" (1.638 m), weight 59.4 kg, last menstrual period 12/19/2014.  PHYSICAL EXAM: Overall appearance:  Healthy appearing, in no distress, Mild inspiratory stridor.  Head:  Normocephalic, atraumatic. Ears: External auditory canals are clear; tympanic membranes are intact and the middle ears are free of any effusion. Nose: External nose is healthy in appearance. Internal nasal exam free of any lesions or obstruction. Oral Cavity/pharynx:  There are no mucosal lesions or masses identified. Hypopharynx/Larynx: no signs of any mucosal lesions or masses identified. Vocal cords move  normally. Neuro:  No identifiable neurologic deficits. Neck: No palpable neck masses.  Studies Reviewed: none    Assessment/Plan Subglottic stenosis, proceed with microlaryngoscopy, CO2 laser ablation and balloon dilation.   Izora Gala 07/16/2020, 1:15 PM

## 2020-07-20 ENCOUNTER — Other Ambulatory Visit: Payer: Self-pay

## 2020-07-20 ENCOUNTER — Encounter (HOSPITAL_BASED_OUTPATIENT_CLINIC_OR_DEPARTMENT_OTHER)
Admission: RE | Disposition: A | Discharge status: Home / Self Care | Payer: Self-pay | Source: Home / Self Care | Attending: Otolaryngology

## 2020-07-20 ENCOUNTER — Ambulatory Visit (HOSPITAL_BASED_OUTPATIENT_CLINIC_OR_DEPARTMENT_OTHER): Payer: BC Managed Care – PPO | Admitting: Certified Registered"

## 2020-07-20 ENCOUNTER — Encounter (HOSPITAL_BASED_OUTPATIENT_CLINIC_OR_DEPARTMENT_OTHER): Payer: Self-pay | Admitting: Otolaryngology

## 2020-07-20 ENCOUNTER — Ambulatory Visit (HOSPITAL_BASED_OUTPATIENT_CLINIC_OR_DEPARTMENT_OTHER)
Admission: RE | Admit: 2020-07-20 | Discharge: 2020-07-20 | Disposition: A | Discharge status: Home / Self Care | Payer: BC Managed Care – PPO | Attending: Otolaryngology | Admitting: Otolaryngology

## 2020-07-20 DIAGNOSIS — K219 Gastro-esophageal reflux disease without esophagitis: Secondary | ICD-10-CM | POA: Diagnosis not present

## 2020-07-20 DIAGNOSIS — J398 Other specified diseases of upper respiratory tract: Secondary | ICD-10-CM | POA: Diagnosis not present

## 2020-07-20 DIAGNOSIS — M722 Plantar fascial fibromatosis: Secondary | ICD-10-CM | POA: Diagnosis not present

## 2020-07-20 DIAGNOSIS — F418 Other specified anxiety disorders: Secondary | ICD-10-CM | POA: Diagnosis not present

## 2020-07-20 DIAGNOSIS — J386 Stenosis of larynx: Secondary | ICD-10-CM | POA: Insufficient documentation

## 2020-07-20 DIAGNOSIS — Z885 Allergy status to narcotic agent status: Secondary | ICD-10-CM | POA: Diagnosis not present

## 2020-07-20 HISTORY — PX: MICROLARYNGOSCOPY WITH LASER AND BALLOON DILATION: SHX5973

## 2020-07-20 SURGERY — MICROLARYNGOSCOPY WITH LASER AND BALLOON DILATION
Anesthesia: General | Site: Throat

## 2020-07-20 MED ORDER — LACTATED RINGERS IV SOLN: INTRAVENOUS | Status: DC

## 2020-07-20 MED ORDER — DEXAMETHASONE SODIUM PHOSPHATE 10 MG/ML IJ SOLN
INTRAMUSCULAR | Status: AC
  Filled 2020-07-20: qty 1

## 2020-07-20 MED ORDER — LIDOCAINE HCL (PF) 2 % IJ SOLN
INTRAMUSCULAR | Status: AC
  Filled 2020-07-20: qty 5

## 2020-07-20 MED ORDER — SUGAMMADEX SODIUM 500 MG/5ML IV SOLN
INTRAVENOUS | Status: DC | PRN
  Administered 2020-07-20: 500 mg via INTRAVENOUS

## 2020-07-20 MED ORDER — FENTANYL CITRATE (PF) 100 MCG/2ML IJ SOLN
INTRAMUSCULAR | Status: AC
  Filled 2020-07-20: qty 2

## 2020-07-20 MED ORDER — MIDAZOLAM HCL 2 MG/2ML IJ SOLN
INTRAMUSCULAR | Status: AC
  Filled 2020-07-20: qty 2

## 2020-07-20 MED ORDER — AMISULPRIDE (ANTIEMETIC) 5 MG/2ML IV SOLN: 10.0000 mg | Freq: Once | INTRAVENOUS | Status: DC | PRN

## 2020-07-20 MED ORDER — SUGAMMADEX SODIUM 500 MG/5ML IV SOLN
INTRAVENOUS | Status: AC
  Filled 2020-07-20: qty 5

## 2020-07-20 MED ORDER — PROPOFOL 500 MG/50ML IV EMUL
INTRAVENOUS | Status: AC
  Filled 2020-07-20: qty 50

## 2020-07-20 MED ORDER — CEPHALEXIN 500 MG PO CAPS: 500.0000 mg | ORAL_CAPSULE | Freq: Three times a day (TID) | ORAL | 0 refills | Status: DC

## 2020-07-20 MED ORDER — FENTANYL CITRATE (PF) 100 MCG/2ML IJ SOLN: 25.0000 ug | INTRAMUSCULAR | Status: DC | PRN

## 2020-07-20 MED ORDER — PROPOFOL 10 MG/ML IV BOLUS
INTRAVENOUS | Status: AC
  Filled 2020-07-20: qty 60

## 2020-07-20 MED ORDER — OXYCODONE HCL 5 MG/5ML PO SOLN: 5.0000 mg | Freq: Once | ORAL | Status: DC | PRN

## 2020-07-20 MED ORDER — PROPOFOL 10 MG/ML IV BOLUS
INTRAVENOUS | Status: DC | PRN
  Administered 2020-07-20: 40 mg via INTRAVENOUS
  Administered 2020-07-20: 160 mg via INTRAVENOUS

## 2020-07-20 MED ORDER — ROCURONIUM BROMIDE 10 MG/ML (PF) SYRINGE
PREFILLED_SYRINGE | INTRAVENOUS | Status: AC
  Filled 2020-07-20: qty 10

## 2020-07-20 MED ORDER — ROCURONIUM BROMIDE 100 MG/10ML IV SOLN
INTRAVENOUS | Status: DC | PRN
  Administered 2020-07-20: 60 mg via INTRAVENOUS

## 2020-07-20 MED ORDER — LIDOCAINE 2% (20 MG/ML) 5 ML SYRINGE
INTRAMUSCULAR | Status: DC | PRN
  Administered 2020-07-20: 60 mg via INTRAVENOUS

## 2020-07-20 MED ORDER — FENTANYL CITRATE (PF) 100 MCG/2ML IJ SOLN
INTRAMUSCULAR | Status: DC | PRN
  Administered 2020-07-20: 50 ug via INTRAVENOUS

## 2020-07-20 MED ORDER — OXYMETAZOLINE HCL 0.05 % NA SOLN
NASAL | Status: AC
  Filled 2020-07-20: qty 30

## 2020-07-20 MED ORDER — PROPOFOL 500 MG/50ML IV EMUL
INTRAVENOUS | Status: DC | PRN
  Administered 2020-07-20: 150 ug/kg/min via INTRAVENOUS

## 2020-07-20 MED ORDER — LIDOCAINE-EPINEPHRINE 1 %-1:100000 IJ SOLN
INTRAMUSCULAR | Status: AC
  Filled 2020-07-20: qty 1

## 2020-07-20 MED ORDER — METHYLENE BLUE 0.5 % INJ SOLN
INTRAVENOUS | Status: AC
  Filled 2020-07-20: qty 20

## 2020-07-20 MED ORDER — MIDAZOLAM HCL 5 MG/5ML IJ SOLN
INTRAMUSCULAR | Status: DC | PRN
  Administered 2020-07-20: 2 mg via INTRAVENOUS

## 2020-07-20 MED ORDER — ONDANSETRON HCL 4 MG/2ML IJ SOLN: 4.0000 mg | Freq: Once | INTRAMUSCULAR | Status: DC | PRN

## 2020-07-20 MED ORDER — EPINEPHRINE PF 1 MG/ML IJ SOLN
INTRAMUSCULAR | Status: AC
  Filled 2020-07-20: qty 4

## 2020-07-20 MED ORDER — SODIUM CHLORIDE (PF) 0.9 % IJ SOLN
INTRAMUSCULAR | Status: AC
  Filled 2020-07-20: qty 20

## 2020-07-20 MED ORDER — ONDANSETRON HCL 4 MG/2ML IJ SOLN
INTRAMUSCULAR | Status: AC
  Filled 2020-07-20: qty 2

## 2020-07-20 MED ORDER — ONDANSETRON HCL 4 MG/2ML IJ SOLN
INTRAMUSCULAR | Status: DC | PRN
  Administered 2020-07-20: 4 mg via INTRAVENOUS

## 2020-07-20 MED ORDER — DEXAMETHASONE SODIUM PHOSPHATE 10 MG/ML IJ SOLN
INTRAMUSCULAR | Status: DC | PRN
  Administered 2020-07-20: 10 mg via INTRAVENOUS

## 2020-07-20 MED ORDER — OXYCODONE HCL 5 MG PO TABS: 5.0000 mg | ORAL_TABLET | Freq: Once | ORAL | Status: DC | PRN

## 2020-07-20 SURGICAL SUPPLY — 31 items
BALLN PULM 12 13.5 15X75 (BALLOONS) ×2
BALLN PULM 15 16.5 18X75 (BALLOONS)
BALLN PULMONARY 10-12 (MISCELLANEOUS) IMPLANT
BALLN PULMONARY 8-10 OD75 (MISCELLANEOUS) IMPLANT
BALLOON PULM 12 13.5 15X75 (BALLOONS) ×1 IMPLANT
BALLOON PULM 15 16.5 18X75 (BALLOONS) IMPLANT
BNDG EYE OVAL (GAUZE/BANDAGES/DRESSINGS) IMPLANT
CANISTER SUCT 1200ML W/VALVE (MISCELLANEOUS) ×2 IMPLANT
DEPRESSOR TONGUE BLADE STERILE (MISCELLANEOUS) ×2 IMPLANT
GAUZE SPONGE 4X4 12PLY STRL LF (GAUZE/BANDAGES/DRESSINGS) ×4 IMPLANT
GLOVE SURG LTX SZ7.5 (GLOVE) ×2 IMPLANT
GOWN STRL REUS W/ TWL LRG LVL3 (GOWN DISPOSABLE) IMPLANT
GOWN STRL REUS W/ TWL XL LVL3 (GOWN DISPOSABLE) IMPLANT
GOWN STRL REUS W/TWL LRG LVL3 (GOWN DISPOSABLE)
GOWN STRL REUS W/TWL XL LVL3 (GOWN DISPOSABLE)
GUARD TEETH (MISCELLANEOUS) ×2 IMPLANT
MARKER SKIN DUAL TIP RULER LAB (MISCELLANEOUS) IMPLANT
NEEDLE SPNL 22GX7 QUINCKE BK (NEEDLE) IMPLANT
NS IRRIG 1000ML POUR BTL (IV SOLUTION) ×2 IMPLANT
PACK BASIN DAY SURGERY FS (CUSTOM PROCEDURE TRAY) ×2 IMPLANT
PATTIES SURGICAL .5 X3 (DISPOSABLE) IMPLANT
SHEET MEDIUM DRAPE 40X70 STRL (DRAPES) ×2 IMPLANT
SLEEVE SCD COMPRESS KNEE MED (STOCKING) ×2 IMPLANT
SOLUTION BUTLER CLEAR DIP (MISCELLANEOUS) IMPLANT
SURGILUBE 2OZ TUBE FLIPTOP (MISCELLANEOUS) IMPLANT
SYR 5ML LL (SYRINGE) IMPLANT
SYR CONTROL 10ML LL (SYRINGE) IMPLANT
SYR GAUGE ASSEMBLY ALLIANCE II (MISCELLANEOUS) ×2 IMPLANT
SYR TB 1ML LL NO SAFETY (SYRINGE) IMPLANT
TOWEL GREEN STERILE FF (TOWEL DISPOSABLE) ×2 IMPLANT
TUBE CONNECTING 20X1/4 (TUBING) ×4 IMPLANT

## 2020-07-20 NOTE — Discharge Instructions (Addendum)
Resume diet and activities as tolerated.     Post Anesthesia Home Care Instructions  Activity: Get plenty of rest for the remainder of the day. A responsible individual must stay with you for 24 hours following the procedure.  For the next 24 hours, DO NOT: -Drive a car -Paediatric nurse -Drink alcoholic beverages -Take any medication unless instructed by your physician -Make any legal decisions or sign important papers.  Meals: Start with liquid foods such as gelatin or soup. Progress to regular foods as tolerated. Avoid greasy, spicy, heavy foods. If nausea and/or vomiting occur, drink only clear liquids until the nausea and/or vomiting subsides. Call your physician if vomiting continues.  Special Instructions/Symptoms: Your throat may feel dry or sore from the anesthesia or the breathing tube placed in your throat during surgery. If this causes discomfort, gargle with warm salt water. The discomfort should disappear within 24 hours.  If you had a scopolamine patch placed behind your ear for the management of post- operative nausea and/or vomiting:  1. The medication in the patch is effective for 72 hours, after which it should be removed.  Wrap patch in a tissue and discard in the trash. Wash hands thoroughly with soap and water. 2. You may remove the patch earlier than 72 hours if you experience unpleasant side effects which may include dry mouth, dizziness or visual disturbances. 3. Avoid touching the patch. Wash your hands with soap and water after contact with the patch.

## 2020-07-20 NOTE — Anesthesia Procedure Notes (Signed)
Procedure Name: Intubation Date/Time: 07/20/2020 8:05 AM Performed by: Lavonia Dana, CRNA Pre-anesthesia Checklist: Patient identified, Emergency Drugs available, Suction available and Patient being monitored Patient Re-evaluated:Patient Re-evaluated prior to induction Oxygen Delivery Method: Circle system utilized Preoxygenation: Pre-oxygenation with 100% oxygen Induction Type: IV induction Ventilation: Mask ventilation without difficulty Laryngoscope Size: Mac and 3 Grade View: Grade I Tube type: Oral Tube size: 6.0 mm Number of attempts: 1 Airway Equipment and Method: Stylet and Bite block Placement Confirmation: ETT inserted through vocal cords under direct vision, positive ETCO2 and breath sounds checked- equal and bilateral Secured at: 22 cm Tube secured with: Tape Dental Injury: Teeth and Oropharynx as per pre-operative assessment

## 2020-07-20 NOTE — Interval H&P Note (Signed)
History and Physical Interval Note:  07/20/2020 7:12 AM  Toni Gordon  has presented today for surgery, with the diagnosis of Tracheal Stenosis.  The various methods of treatment have been discussed with the patient and family. After consideration of risks, benefits and other options for treatment, the patient has consented to  Procedure(s): MICROLARYNGOSCOPY WITH BALLOON DILATION (N/A) as a surgical intervention.  The patient's history has been reviewed, patient examined, no change in status, stable for surgery.  I have reviewed the patient's chart and labs.  Questions were answered to the patient's satisfaction.     Izora Gala

## 2020-07-20 NOTE — Op Note (Signed)
OPERATIVE REPORT  DATE OF SURGERY: 07/20/2020  PATIENT:  Toni Gordon,  55 y.o. female  PRE-OPERATIVE DIAGNOSIS: Idiopathic subglottic stenosis  POST-OPERATIVE DIAGNOSIS: Idiopathic subglottic stenosis  PROCEDURE:  Procedure(s): MICROLARYNGOSCOPY WITH LASER AND BALLOON DILATION  SURGEON:  Beckie Salts, MD  ASSISTANTS: None  ANESTHESIA:   General   EBL: Less than 10 ml  DRAINS: None  LOCAL MEDICATIONS USED:  None  SPECIMEN:  none  COUNTS:  Correct  PROCEDURE DETAILS: The patient was taken to the operating room and placed on the operating table in the supine position. Following induction of general endotracheal anesthesia, using a #6 endotracheal tube, the table was turned 90 degrees and the patient was draped in standard fashion.  A maxillary tooth protector was used throughout the case.  The operating microscope was positioned.  A Jako laryngoscope was entered into the oral cavity and used to visualize the larynx.  This was attached to the Bland stand with the suspension apparatus.  The operating microscope was then brought into view.  The endotracheal tube was removed and then replaced through the laryngoscope.  For the remainder of the case the endotracheal tube was removed as needed and then replaced.  At the end of the case a #7 endotracheal tube was placed without difficulty.  Saline soaked eye pads and saline soaked towels were placed for laser protection around the eyes and the face respectively.  The subglottic larynx was inspected and there was a circumferential stenotic segment mainly involving from approximately 9:00 to approximately 5:00 going clockwise.  The carbon oxide laser was set at 2 W continuous power and radial cuts were created at 12:00 10:00 2:00 and 4:00.  The BRE balloon dilator was used at a pressure of 4.5 atm corresponding to 12 mm.  This was done twice.  Each time for 30 seconds.  Secretions were suctioned.  The #7 endotracheal tube was replaced and the  care of the patient was taken back to anesthesia for extubation.    PATIENT DISPOSITION:  To PACU, stable

## 2020-07-20 NOTE — Anesthesia Preprocedure Evaluation (Signed)
Anesthesia Evaluation  Patient identified by MRN, date of birth, ID band Patient awake    Reviewed: Allergy & Precautions, NPO status , Patient's Chart, lab work & pertinent test results  History of Anesthesia Complications (+) PONVNegative for: history of anesthetic complications  Airway Mallampati: II  TM Distance: >3 FB Neck ROM: Full    Dental  (+) Dental Advisory Given   Pulmonary neg pulmonary ROS,    Pulmonary exam normal        Cardiovascular negative cardio ROS Normal cardiovascular exam     Neuro/Psych negative neurological ROS     GI/Hepatic Neg liver ROS, GERD  ,  Endo/Other  negative endocrine ROS  Renal/GU negative Renal ROS  negative genitourinary   Musculoskeletal negative musculoskeletal ROS (+)   Abdominal   Peds  Hematology negative hematology ROS (+)   Anesthesia Other Findings  Idiopathic subglottic stenosis  Reproductive/Obstetrics                             Anesthesia Physical Anesthesia Plan  ASA: 2  Anesthesia Plan: General   Post-op Pain Management:    Induction: Intravenous  PONV Risk Score and Plan: 4 or greater and Ondansetron, Dexamethasone, Treatment may vary due to age or medical condition and Midazolam  Airway Management Planned: Oral ETT  Additional Equipment: None  Intra-op Plan:   Post-operative Plan: Extubation in OR  Informed Consent: I have reviewed the patients History and Physical, chart, labs and discussed the procedure including the risks, benefits and alternatives for the proposed anesthesia with the patient or authorized representative who has indicated his/her understanding and acceptance.     Dental advisory given  Plan Discussed with:   Anesthesia Plan Comments:         Anesthesia Quick Evaluation

## 2020-07-20 NOTE — Anesthesia Postprocedure Evaluation (Signed)
Anesthesia Post Note  Patient: Toni Gordon  Procedure(s) Performed: MICROLARYNGOSCOPY WITH LASER AND BALLOON DILATION (Throat)     Patient location during evaluation: PACU Anesthesia Type: General Level of consciousness: awake and alert Pain management: pain level controlled Vital Signs Assessment: post-procedure vital signs reviewed and stable Respiratory status: spontaneous breathing, nonlabored ventilation and respiratory function stable Cardiovascular status: blood pressure returned to baseline and stable Postop Assessment: no apparent nausea or vomiting Anesthetic complications: no   No notable events documented.  Last Vitals:  Vitals:   07/20/20 0915 07/20/20 0948  BP: 131/85 (!) 142/84  Pulse: (!) 58 65  Resp: 11 14  Temp:  36.4 C  SpO2: 96% 98%    Last Pain:  Vitals:   07/20/20 0927  TempSrc:   PainSc: 0-No pain                 Lidia Collum

## 2020-07-20 NOTE — Transfer of Care (Signed)
Immediate Anesthesia Transfer of Care Note  Patient: Toni Gordon  Procedure(s) Performed: MICROLARYNGOSCOPY WITH LASER AND BALLOON DILATION (Throat)  Patient Location: PACU  Anesthesia Type:General  Level of Consciousness: awake, alert  and oriented  Airway & Oxygen Therapy: Patient Spontanous Breathing and Patient connected to face mask oxygen  Post-op Assessment: Report given to RN and Post -op Vital signs reviewed and stable  Post vital signs: Reviewed and stable  Last Vitals:  Vitals Value Taken Time  BP 141/84 07/20/20 0853  Temp 36.4 C 07/20/20 0853  Pulse 64 07/20/20 0856  Resp 15 07/20/20 0856  SpO2 100 % 07/20/20 0856  Vitals shown include unvalidated device data.  Last Pain:  Vitals:   07/20/20 6568  TempSrc: Oral  PainSc: 0-No pain         Complications: No notable events documented.

## 2020-07-22 ENCOUNTER — Encounter (HOSPITAL_BASED_OUTPATIENT_CLINIC_OR_DEPARTMENT_OTHER): Payer: Self-pay | Admitting: Otolaryngology

## 2020-07-23 DIAGNOSIS — J3081 Allergic rhinitis due to animal (cat) (dog) hair and dander: Secondary | ICD-10-CM | POA: Diagnosis not present

## 2020-07-23 DIAGNOSIS — J3089 Other allergic rhinitis: Secondary | ICD-10-CM | POA: Diagnosis not present

## 2020-07-23 DIAGNOSIS — J301 Allergic rhinitis due to pollen: Secondary | ICD-10-CM | POA: Diagnosis not present

## 2020-07-27 DIAGNOSIS — J3089 Other allergic rhinitis: Secondary | ICD-10-CM | POA: Diagnosis not present

## 2020-07-27 DIAGNOSIS — J3081 Allergic rhinitis due to animal (cat) (dog) hair and dander: Secondary | ICD-10-CM | POA: Diagnosis not present

## 2020-07-27 DIAGNOSIS — J301 Allergic rhinitis due to pollen: Secondary | ICD-10-CM | POA: Diagnosis not present

## 2020-07-29 DIAGNOSIS — J301 Allergic rhinitis due to pollen: Secondary | ICD-10-CM | POA: Diagnosis not present

## 2020-07-29 DIAGNOSIS — J3081 Allergic rhinitis due to animal (cat) (dog) hair and dander: Secondary | ICD-10-CM | POA: Diagnosis not present

## 2020-07-29 DIAGNOSIS — J3089 Other allergic rhinitis: Secondary | ICD-10-CM | POA: Diagnosis not present

## 2020-08-05 DIAGNOSIS — J301 Allergic rhinitis due to pollen: Secondary | ICD-10-CM | POA: Diagnosis not present

## 2020-08-05 DIAGNOSIS — J3089 Other allergic rhinitis: Secondary | ICD-10-CM | POA: Diagnosis not present

## 2020-08-05 DIAGNOSIS — J3081 Allergic rhinitis due to animal (cat) (dog) hair and dander: Secondary | ICD-10-CM | POA: Diagnosis not present

## 2020-08-07 DIAGNOSIS — J301 Allergic rhinitis due to pollen: Secondary | ICD-10-CM | POA: Diagnosis not present

## 2020-08-07 DIAGNOSIS — J3081 Allergic rhinitis due to animal (cat) (dog) hair and dander: Secondary | ICD-10-CM | POA: Diagnosis not present

## 2020-08-07 DIAGNOSIS — J3089 Other allergic rhinitis: Secondary | ICD-10-CM | POA: Diagnosis not present

## 2020-08-11 DIAGNOSIS — J301 Allergic rhinitis due to pollen: Secondary | ICD-10-CM | POA: Diagnosis not present

## 2020-08-11 DIAGNOSIS — J3081 Allergic rhinitis due to animal (cat) (dog) hair and dander: Secondary | ICD-10-CM | POA: Diagnosis not present

## 2020-08-11 DIAGNOSIS — J3089 Other allergic rhinitis: Secondary | ICD-10-CM | POA: Diagnosis not present

## 2020-08-12 DIAGNOSIS — H60332 Swimmer's ear, left ear: Secondary | ICD-10-CM | POA: Diagnosis not present

## 2020-09-08 DIAGNOSIS — J3089 Other allergic rhinitis: Secondary | ICD-10-CM | POA: Diagnosis not present

## 2020-09-08 DIAGNOSIS — J3081 Allergic rhinitis due to animal (cat) (dog) hair and dander: Secondary | ICD-10-CM | POA: Diagnosis not present

## 2020-09-08 DIAGNOSIS — J301 Allergic rhinitis due to pollen: Secondary | ICD-10-CM | POA: Diagnosis not present

## 2020-09-21 DIAGNOSIS — J301 Allergic rhinitis due to pollen: Secondary | ICD-10-CM | POA: Diagnosis not present

## 2020-09-21 DIAGNOSIS — J3089 Other allergic rhinitis: Secondary | ICD-10-CM | POA: Diagnosis not present

## 2020-09-21 DIAGNOSIS — J3081 Allergic rhinitis due to animal (cat) (dog) hair and dander: Secondary | ICD-10-CM | POA: Diagnosis not present

## 2020-10-06 DIAGNOSIS — J301 Allergic rhinitis due to pollen: Secondary | ICD-10-CM | POA: Diagnosis not present

## 2020-10-06 DIAGNOSIS — J3089 Other allergic rhinitis: Secondary | ICD-10-CM | POA: Diagnosis not present

## 2020-10-06 DIAGNOSIS — J3081 Allergic rhinitis due to animal (cat) (dog) hair and dander: Secondary | ICD-10-CM | POA: Diagnosis not present

## 2020-10-23 DIAGNOSIS — J3081 Allergic rhinitis due to animal (cat) (dog) hair and dander: Secondary | ICD-10-CM | POA: Diagnosis not present

## 2020-10-23 DIAGNOSIS — J3089 Other allergic rhinitis: Secondary | ICD-10-CM | POA: Diagnosis not present

## 2020-10-23 DIAGNOSIS — J301 Allergic rhinitis due to pollen: Secondary | ICD-10-CM | POA: Diagnosis not present

## 2020-11-02 DIAGNOSIS — K219 Gastro-esophageal reflux disease without esophagitis: Secondary | ICD-10-CM | POA: Diagnosis not present

## 2020-11-02 DIAGNOSIS — R052 Subacute cough: Secondary | ICD-10-CM | POA: Diagnosis not present

## 2020-11-02 DIAGNOSIS — Z8709 Personal history of other diseases of the respiratory system: Secondary | ICD-10-CM | POA: Diagnosis not present

## 2020-11-03 DIAGNOSIS — J3081 Allergic rhinitis due to animal (cat) (dog) hair and dander: Secondary | ICD-10-CM | POA: Diagnosis not present

## 2020-11-03 DIAGNOSIS — J3089 Other allergic rhinitis: Secondary | ICD-10-CM | POA: Diagnosis not present

## 2020-11-03 DIAGNOSIS — J301 Allergic rhinitis due to pollen: Secondary | ICD-10-CM | POA: Diagnosis not present

## 2020-11-09 DIAGNOSIS — R059 Cough, unspecified: Secondary | ICD-10-CM | POA: Diagnosis not present

## 2020-11-17 DIAGNOSIS — J3089 Other allergic rhinitis: Secondary | ICD-10-CM | POA: Diagnosis not present

## 2020-11-17 DIAGNOSIS — J3081 Allergic rhinitis due to animal (cat) (dog) hair and dander: Secondary | ICD-10-CM | POA: Diagnosis not present

## 2020-11-17 DIAGNOSIS — J301 Allergic rhinitis due to pollen: Secondary | ICD-10-CM | POA: Diagnosis not present

## 2020-11-23 DIAGNOSIS — J3089 Other allergic rhinitis: Secondary | ICD-10-CM | POA: Diagnosis not present

## 2020-11-23 DIAGNOSIS — J3081 Allergic rhinitis due to animal (cat) (dog) hair and dander: Secondary | ICD-10-CM | POA: Diagnosis not present

## 2020-11-23 DIAGNOSIS — J301 Allergic rhinitis due to pollen: Secondary | ICD-10-CM | POA: Diagnosis not present

## 2020-12-01 DIAGNOSIS — J301 Allergic rhinitis due to pollen: Secondary | ICD-10-CM | POA: Diagnosis not present

## 2020-12-01 DIAGNOSIS — J3081 Allergic rhinitis due to animal (cat) (dog) hair and dander: Secondary | ICD-10-CM | POA: Diagnosis not present

## 2020-12-01 DIAGNOSIS — J3089 Other allergic rhinitis: Secondary | ICD-10-CM | POA: Diagnosis not present

## 2020-12-15 DIAGNOSIS — J3089 Other allergic rhinitis: Secondary | ICD-10-CM | POA: Diagnosis not present

## 2020-12-15 DIAGNOSIS — J301 Allergic rhinitis due to pollen: Secondary | ICD-10-CM | POA: Diagnosis not present

## 2020-12-15 DIAGNOSIS — J3081 Allergic rhinitis due to animal (cat) (dog) hair and dander: Secondary | ICD-10-CM | POA: Diagnosis not present

## 2020-12-17 DIAGNOSIS — J301 Allergic rhinitis due to pollen: Secondary | ICD-10-CM | POA: Diagnosis not present

## 2020-12-17 DIAGNOSIS — M67911 Unspecified disorder of synovium and tendon, right shoulder: Secondary | ICD-10-CM | POA: Diagnosis not present

## 2020-12-17 DIAGNOSIS — M542 Cervicalgia: Secondary | ICD-10-CM | POA: Diagnosis not present

## 2020-12-18 DIAGNOSIS — J3089 Other allergic rhinitis: Secondary | ICD-10-CM | POA: Diagnosis not present

## 2020-12-18 DIAGNOSIS — J3081 Allergic rhinitis due to animal (cat) (dog) hair and dander: Secondary | ICD-10-CM | POA: Diagnosis not present

## 2020-12-29 DIAGNOSIS — J301 Allergic rhinitis due to pollen: Secondary | ICD-10-CM | POA: Diagnosis not present

## 2020-12-29 DIAGNOSIS — J3081 Allergic rhinitis due to animal (cat) (dog) hair and dander: Secondary | ICD-10-CM | POA: Diagnosis not present

## 2020-12-29 DIAGNOSIS — J3089 Other allergic rhinitis: Secondary | ICD-10-CM | POA: Diagnosis not present

## 2021-01-13 DIAGNOSIS — H60332 Swimmer's ear, left ear: Secondary | ICD-10-CM | POA: Diagnosis not present

## 2021-01-13 DIAGNOSIS — U071 COVID-19: Secondary | ICD-10-CM | POA: Diagnosis not present

## 2021-01-21 DIAGNOSIS — B9689 Other specified bacterial agents as the cause of diseases classified elsewhere: Secondary | ICD-10-CM | POA: Diagnosis not present

## 2021-01-21 DIAGNOSIS — R059 Cough, unspecified: Secondary | ICD-10-CM | POA: Diagnosis not present

## 2021-01-21 DIAGNOSIS — J028 Acute pharyngitis due to other specified organisms: Secondary | ICD-10-CM | POA: Diagnosis not present

## 2021-01-21 DIAGNOSIS — Z8616 Personal history of COVID-19: Secondary | ICD-10-CM | POA: Diagnosis not present

## 2021-01-27 DIAGNOSIS — J3089 Other allergic rhinitis: Secondary | ICD-10-CM | POA: Diagnosis not present

## 2021-01-27 DIAGNOSIS — J301 Allergic rhinitis due to pollen: Secondary | ICD-10-CM | POA: Diagnosis not present

## 2021-01-27 DIAGNOSIS — J3081 Allergic rhinitis due to animal (cat) (dog) hair and dander: Secondary | ICD-10-CM | POA: Diagnosis not present

## 2021-01-29 DIAGNOSIS — J3081 Allergic rhinitis due to animal (cat) (dog) hair and dander: Secondary | ICD-10-CM | POA: Diagnosis not present

## 2021-01-29 DIAGNOSIS — J301 Allergic rhinitis due to pollen: Secondary | ICD-10-CM | POA: Diagnosis not present

## 2021-01-29 DIAGNOSIS — J3089 Other allergic rhinitis: Secondary | ICD-10-CM | POA: Diagnosis not present

## 2021-02-02 DIAGNOSIS — J3081 Allergic rhinitis due to animal (cat) (dog) hair and dander: Secondary | ICD-10-CM | POA: Diagnosis not present

## 2021-02-02 DIAGNOSIS — J3089 Other allergic rhinitis: Secondary | ICD-10-CM | POA: Diagnosis not present

## 2021-02-02 DIAGNOSIS — J301 Allergic rhinitis due to pollen: Secondary | ICD-10-CM | POA: Diagnosis not present

## 2021-02-11 DIAGNOSIS — J301 Allergic rhinitis due to pollen: Secondary | ICD-10-CM | POA: Diagnosis not present

## 2021-02-11 DIAGNOSIS — J3089 Other allergic rhinitis: Secondary | ICD-10-CM | POA: Diagnosis not present

## 2021-02-11 DIAGNOSIS — J3081 Allergic rhinitis due to animal (cat) (dog) hair and dander: Secondary | ICD-10-CM | POA: Diagnosis not present

## 2021-02-15 DIAGNOSIS — J3089 Other allergic rhinitis: Secondary | ICD-10-CM | POA: Diagnosis not present

## 2021-02-15 DIAGNOSIS — J3081 Allergic rhinitis due to animal (cat) (dog) hair and dander: Secondary | ICD-10-CM | POA: Diagnosis not present

## 2021-02-15 DIAGNOSIS — J301 Allergic rhinitis due to pollen: Secondary | ICD-10-CM | POA: Diagnosis not present

## 2021-02-18 DIAGNOSIS — J301 Allergic rhinitis due to pollen: Secondary | ICD-10-CM | POA: Diagnosis not present

## 2021-02-18 DIAGNOSIS — J3089 Other allergic rhinitis: Secondary | ICD-10-CM | POA: Diagnosis not present

## 2021-02-18 DIAGNOSIS — J3081 Allergic rhinitis due to animal (cat) (dog) hair and dander: Secondary | ICD-10-CM | POA: Diagnosis not present

## 2021-02-23 DIAGNOSIS — J3081 Allergic rhinitis due to animal (cat) (dog) hair and dander: Secondary | ICD-10-CM | POA: Diagnosis not present

## 2021-02-23 DIAGNOSIS — J301 Allergic rhinitis due to pollen: Secondary | ICD-10-CM | POA: Diagnosis not present

## 2021-02-23 DIAGNOSIS — J3089 Other allergic rhinitis: Secondary | ICD-10-CM | POA: Diagnosis not present

## 2021-03-02 DIAGNOSIS — J3081 Allergic rhinitis due to animal (cat) (dog) hair and dander: Secondary | ICD-10-CM | POA: Diagnosis not present

## 2021-03-02 DIAGNOSIS — J3089 Other allergic rhinitis: Secondary | ICD-10-CM | POA: Diagnosis not present

## 2021-03-02 DIAGNOSIS — J301 Allergic rhinitis due to pollen: Secondary | ICD-10-CM | POA: Diagnosis not present

## 2021-03-24 DIAGNOSIS — J301 Allergic rhinitis due to pollen: Secondary | ICD-10-CM | POA: Diagnosis not present

## 2021-03-24 DIAGNOSIS — J3081 Allergic rhinitis due to animal (cat) (dog) hair and dander: Secondary | ICD-10-CM | POA: Diagnosis not present

## 2021-03-24 DIAGNOSIS — J3089 Other allergic rhinitis: Secondary | ICD-10-CM | POA: Diagnosis not present

## 2021-03-29 DIAGNOSIS — M545 Low back pain, unspecified: Secondary | ICD-10-CM | POA: Diagnosis not present

## 2021-03-31 DIAGNOSIS — Z6823 Body mass index (BMI) 23.0-23.9, adult: Secondary | ICD-10-CM | POA: Diagnosis not present

## 2021-03-31 DIAGNOSIS — Z124 Encounter for screening for malignant neoplasm of cervix: Secondary | ICD-10-CM | POA: Diagnosis not present

## 2021-03-31 DIAGNOSIS — Z01419 Encounter for gynecological examination (general) (routine) without abnormal findings: Secondary | ICD-10-CM | POA: Diagnosis not present

## 2021-04-06 DIAGNOSIS — J3081 Allergic rhinitis due to animal (cat) (dog) hair and dander: Secondary | ICD-10-CM | POA: Diagnosis not present

## 2021-04-06 DIAGNOSIS — J301 Allergic rhinitis due to pollen: Secondary | ICD-10-CM | POA: Diagnosis not present

## 2021-04-06 DIAGNOSIS — J3089 Other allergic rhinitis: Secondary | ICD-10-CM | POA: Diagnosis not present

## 2021-04-07 DIAGNOSIS — M545 Low back pain, unspecified: Secondary | ICD-10-CM | POA: Diagnosis not present

## 2021-04-12 DIAGNOSIS — D2261 Melanocytic nevi of right upper limb, including shoulder: Secondary | ICD-10-CM | POA: Diagnosis not present

## 2021-04-12 DIAGNOSIS — D2262 Melanocytic nevi of left upper limb, including shoulder: Secondary | ICD-10-CM | POA: Diagnosis not present

## 2021-04-12 DIAGNOSIS — D2362 Other benign neoplasm of skin of left upper limb, including shoulder: Secondary | ICD-10-CM | POA: Diagnosis not present

## 2021-04-12 DIAGNOSIS — D485 Neoplasm of uncertain behavior of skin: Secondary | ICD-10-CM | POA: Diagnosis not present

## 2021-04-12 DIAGNOSIS — L080 Pyoderma: Secondary | ICD-10-CM | POA: Diagnosis not present

## 2021-04-12 DIAGNOSIS — D225 Melanocytic nevi of trunk: Secondary | ICD-10-CM | POA: Diagnosis not present

## 2021-04-19 DIAGNOSIS — M545 Low back pain, unspecified: Secondary | ICD-10-CM | POA: Diagnosis not present

## 2021-04-20 DIAGNOSIS — J301 Allergic rhinitis due to pollen: Secondary | ICD-10-CM | POA: Diagnosis not present

## 2021-04-20 DIAGNOSIS — J3081 Allergic rhinitis due to animal (cat) (dog) hair and dander: Secondary | ICD-10-CM | POA: Diagnosis not present

## 2021-04-20 DIAGNOSIS — J3089 Other allergic rhinitis: Secondary | ICD-10-CM | POA: Diagnosis not present

## 2021-05-03 DIAGNOSIS — N951 Menopausal and female climacteric states: Secondary | ICD-10-CM | POA: Diagnosis not present

## 2021-05-03 DIAGNOSIS — Z13220 Encounter for screening for lipoid disorders: Secondary | ICD-10-CM | POA: Diagnosis not present

## 2021-05-03 DIAGNOSIS — Z0000 Encounter for general adult medical examination without abnormal findings: Secondary | ICD-10-CM | POA: Diagnosis not present

## 2021-05-03 DIAGNOSIS — Z131 Encounter for screening for diabetes mellitus: Secondary | ICD-10-CM | POA: Diagnosis not present

## 2021-05-03 DIAGNOSIS — M545 Low back pain, unspecified: Secondary | ICD-10-CM | POA: Diagnosis not present

## 2021-05-03 DIAGNOSIS — J45990 Exercise induced bronchospasm: Secondary | ICD-10-CM | POA: Diagnosis not present

## 2021-05-03 DIAGNOSIS — J398 Other specified diseases of upper respiratory tract: Secondary | ICD-10-CM | POA: Diagnosis not present

## 2021-05-03 DIAGNOSIS — K219 Gastro-esophageal reflux disease without esophagitis: Secondary | ICD-10-CM | POA: Diagnosis not present

## 2021-05-04 DIAGNOSIS — J3081 Allergic rhinitis due to animal (cat) (dog) hair and dander: Secondary | ICD-10-CM | POA: Diagnosis not present

## 2021-05-04 DIAGNOSIS — J301 Allergic rhinitis due to pollen: Secondary | ICD-10-CM | POA: Diagnosis not present

## 2021-05-04 DIAGNOSIS — J3089 Other allergic rhinitis: Secondary | ICD-10-CM | POA: Diagnosis not present

## 2021-05-21 DIAGNOSIS — J3081 Allergic rhinitis due to animal (cat) (dog) hair and dander: Secondary | ICD-10-CM | POA: Diagnosis not present

## 2021-05-21 DIAGNOSIS — J3089 Other allergic rhinitis: Secondary | ICD-10-CM | POA: Diagnosis not present

## 2021-05-21 DIAGNOSIS — H1045 Other chronic allergic conjunctivitis: Secondary | ICD-10-CM | POA: Diagnosis not present

## 2021-05-21 DIAGNOSIS — J301 Allergic rhinitis due to pollen: Secondary | ICD-10-CM | POA: Diagnosis not present

## 2021-06-09 DIAGNOSIS — Z13820 Encounter for screening for osteoporosis: Secondary | ICD-10-CM | POA: Diagnosis not present

## 2021-06-09 DIAGNOSIS — Z1231 Encounter for screening mammogram for malignant neoplasm of breast: Secondary | ICD-10-CM | POA: Diagnosis not present

## 2021-06-10 DIAGNOSIS — J3081 Allergic rhinitis due to animal (cat) (dog) hair and dander: Secondary | ICD-10-CM | POA: Diagnosis not present

## 2021-06-10 DIAGNOSIS — J301 Allergic rhinitis due to pollen: Secondary | ICD-10-CM | POA: Diagnosis not present

## 2021-06-10 DIAGNOSIS — J3089 Other allergic rhinitis: Secondary | ICD-10-CM | POA: Diagnosis not present

## 2021-06-17 DIAGNOSIS — M542 Cervicalgia: Secondary | ICD-10-CM | POA: Diagnosis not present

## 2021-06-23 DIAGNOSIS — J3081 Allergic rhinitis due to animal (cat) (dog) hair and dander: Secondary | ICD-10-CM | POA: Diagnosis not present

## 2021-06-23 DIAGNOSIS — M25511 Pain in right shoulder: Secondary | ICD-10-CM | POA: Diagnosis not present

## 2021-06-23 DIAGNOSIS — J3089 Other allergic rhinitis: Secondary | ICD-10-CM | POA: Diagnosis not present

## 2021-06-23 DIAGNOSIS — J301 Allergic rhinitis due to pollen: Secondary | ICD-10-CM | POA: Diagnosis not present

## 2021-06-29 DIAGNOSIS — M67911 Unspecified disorder of synovium and tendon, right shoulder: Secondary | ICD-10-CM | POA: Diagnosis not present

## 2021-06-29 DIAGNOSIS — M5451 Vertebrogenic low back pain: Secondary | ICD-10-CM | POA: Diagnosis not present

## 2021-07-08 DIAGNOSIS — J3089 Other allergic rhinitis: Secondary | ICD-10-CM | POA: Diagnosis not present

## 2021-07-08 DIAGNOSIS — J301 Allergic rhinitis due to pollen: Secondary | ICD-10-CM | POA: Diagnosis not present

## 2021-07-08 DIAGNOSIS — J3081 Allergic rhinitis due to animal (cat) (dog) hair and dander: Secondary | ICD-10-CM | POA: Diagnosis not present

## 2021-07-26 DIAGNOSIS — J301 Allergic rhinitis due to pollen: Secondary | ICD-10-CM | POA: Diagnosis not present

## 2021-07-26 DIAGNOSIS — J3089 Other allergic rhinitis: Secondary | ICD-10-CM | POA: Diagnosis not present

## 2021-07-26 DIAGNOSIS — J3081 Allergic rhinitis due to animal (cat) (dog) hair and dander: Secondary | ICD-10-CM | POA: Diagnosis not present

## 2021-08-12 DIAGNOSIS — J3089 Other allergic rhinitis: Secondary | ICD-10-CM | POA: Diagnosis not present

## 2021-08-12 DIAGNOSIS — J301 Allergic rhinitis due to pollen: Secondary | ICD-10-CM | POA: Diagnosis not present

## 2021-08-12 DIAGNOSIS — J3081 Allergic rhinitis due to animal (cat) (dog) hair and dander: Secondary | ICD-10-CM | POA: Diagnosis not present

## 2021-09-06 DIAGNOSIS — J301 Allergic rhinitis due to pollen: Secondary | ICD-10-CM | POA: Diagnosis not present

## 2021-09-06 DIAGNOSIS — J3081 Allergic rhinitis due to animal (cat) (dog) hair and dander: Secondary | ICD-10-CM | POA: Diagnosis not present

## 2021-09-06 DIAGNOSIS — J3089 Other allergic rhinitis: Secondary | ICD-10-CM | POA: Diagnosis not present

## 2021-09-08 DIAGNOSIS — B880 Other acariasis: Secondary | ICD-10-CM | POA: Diagnosis not present

## 2021-09-08 DIAGNOSIS — Z23 Encounter for immunization: Secondary | ICD-10-CM | POA: Diagnosis not present

## 2021-09-08 DIAGNOSIS — H938X2 Other specified disorders of left ear: Secondary | ICD-10-CM | POA: Diagnosis not present

## 2021-09-08 DIAGNOSIS — S80869A Insect bite (nonvenomous), unspecified lower leg, initial encounter: Secondary | ICD-10-CM | POA: Diagnosis not present

## 2021-09-08 DIAGNOSIS — B351 Tinea unguium: Secondary | ICD-10-CM | POA: Diagnosis not present

## 2021-09-21 DIAGNOSIS — J3089 Other allergic rhinitis: Secondary | ICD-10-CM | POA: Diagnosis not present

## 2021-09-21 DIAGNOSIS — J301 Allergic rhinitis due to pollen: Secondary | ICD-10-CM | POA: Diagnosis not present

## 2021-09-21 DIAGNOSIS — J3081 Allergic rhinitis due to animal (cat) (dog) hair and dander: Secondary | ICD-10-CM | POA: Diagnosis not present

## 2021-09-29 DIAGNOSIS — J301 Allergic rhinitis due to pollen: Secondary | ICD-10-CM | POA: Diagnosis not present

## 2021-09-30 DIAGNOSIS — J3089 Other allergic rhinitis: Secondary | ICD-10-CM | POA: Diagnosis not present

## 2021-09-30 DIAGNOSIS — J3081 Allergic rhinitis due to animal (cat) (dog) hair and dander: Secondary | ICD-10-CM | POA: Diagnosis not present

## 2021-10-19 DIAGNOSIS — J3081 Allergic rhinitis due to animal (cat) (dog) hair and dander: Secondary | ICD-10-CM | POA: Diagnosis not present

## 2021-10-19 DIAGNOSIS — J301 Allergic rhinitis due to pollen: Secondary | ICD-10-CM | POA: Diagnosis not present

## 2021-10-19 DIAGNOSIS — J3089 Other allergic rhinitis: Secondary | ICD-10-CM | POA: Diagnosis not present

## 2021-11-03 DIAGNOSIS — J301 Allergic rhinitis due to pollen: Secondary | ICD-10-CM | POA: Diagnosis not present

## 2021-11-03 DIAGNOSIS — J3081 Allergic rhinitis due to animal (cat) (dog) hair and dander: Secondary | ICD-10-CM | POA: Diagnosis not present

## 2021-11-03 DIAGNOSIS — J3089 Other allergic rhinitis: Secondary | ICD-10-CM | POA: Diagnosis not present

## 2021-11-15 DIAGNOSIS — J3081 Allergic rhinitis due to animal (cat) (dog) hair and dander: Secondary | ICD-10-CM | POA: Diagnosis not present

## 2021-11-15 DIAGNOSIS — J301 Allergic rhinitis due to pollen: Secondary | ICD-10-CM | POA: Diagnosis not present

## 2021-11-15 DIAGNOSIS — J3089 Other allergic rhinitis: Secondary | ICD-10-CM | POA: Diagnosis not present

## 2021-12-06 DIAGNOSIS — J3089 Other allergic rhinitis: Secondary | ICD-10-CM | POA: Diagnosis not present

## 2021-12-06 DIAGNOSIS — J3081 Allergic rhinitis due to animal (cat) (dog) hair and dander: Secondary | ICD-10-CM | POA: Diagnosis not present

## 2021-12-06 DIAGNOSIS — J301 Allergic rhinitis due to pollen: Secondary | ICD-10-CM | POA: Diagnosis not present

## 2021-12-20 DIAGNOSIS — J3089 Other allergic rhinitis: Secondary | ICD-10-CM | POA: Diagnosis not present

## 2021-12-20 DIAGNOSIS — J3081 Allergic rhinitis due to animal (cat) (dog) hair and dander: Secondary | ICD-10-CM | POA: Diagnosis not present

## 2021-12-20 DIAGNOSIS — J301 Allergic rhinitis due to pollen: Secondary | ICD-10-CM | POA: Diagnosis not present

## 2022-01-05 DIAGNOSIS — J3081 Allergic rhinitis due to animal (cat) (dog) hair and dander: Secondary | ICD-10-CM | POA: Diagnosis not present

## 2022-01-05 DIAGNOSIS — J301 Allergic rhinitis due to pollen: Secondary | ICD-10-CM | POA: Diagnosis not present

## 2022-01-05 DIAGNOSIS — J3089 Other allergic rhinitis: Secondary | ICD-10-CM | POA: Diagnosis not present

## 2022-01-17 DIAGNOSIS — J301 Allergic rhinitis due to pollen: Secondary | ICD-10-CM | POA: Diagnosis not present

## 2022-01-17 DIAGNOSIS — D2362 Other benign neoplasm of skin of left upper limb, including shoulder: Secondary | ICD-10-CM | POA: Diagnosis not present

## 2022-01-17 DIAGNOSIS — J3089 Other allergic rhinitis: Secondary | ICD-10-CM | POA: Diagnosis not present

## 2022-01-17 DIAGNOSIS — L810 Postinflammatory hyperpigmentation: Secondary | ICD-10-CM | POA: Diagnosis not present

## 2022-01-17 DIAGNOSIS — L814 Other melanin hyperpigmentation: Secondary | ICD-10-CM | POA: Diagnosis not present

## 2022-01-17 DIAGNOSIS — L570 Actinic keratosis: Secondary | ICD-10-CM | POA: Diagnosis not present

## 2022-01-17 DIAGNOSIS — J3081 Allergic rhinitis due to animal (cat) (dog) hair and dander: Secondary | ICD-10-CM | POA: Diagnosis not present

## 2022-01-17 DIAGNOSIS — L918 Other hypertrophic disorders of the skin: Secondary | ICD-10-CM | POA: Diagnosis not present

## 2022-01-20 DIAGNOSIS — M67911 Unspecified disorder of synovium and tendon, right shoulder: Secondary | ICD-10-CM | POA: Diagnosis not present

## 2022-01-20 DIAGNOSIS — M7701 Medial epicondylitis, right elbow: Secondary | ICD-10-CM | POA: Diagnosis not present

## 2022-01-20 DIAGNOSIS — M25511 Pain in right shoulder: Secondary | ICD-10-CM | POA: Diagnosis not present

## 2022-02-03 DIAGNOSIS — J301 Allergic rhinitis due to pollen: Secondary | ICD-10-CM | POA: Diagnosis not present

## 2022-02-03 DIAGNOSIS — J3089 Other allergic rhinitis: Secondary | ICD-10-CM | POA: Diagnosis not present

## 2022-02-03 DIAGNOSIS — J3081 Allergic rhinitis due to animal (cat) (dog) hair and dander: Secondary | ICD-10-CM | POA: Diagnosis not present

## 2022-02-08 DIAGNOSIS — J3089 Other allergic rhinitis: Secondary | ICD-10-CM | POA: Diagnosis not present

## 2022-02-08 DIAGNOSIS — J301 Allergic rhinitis due to pollen: Secondary | ICD-10-CM | POA: Diagnosis not present

## 2022-02-08 DIAGNOSIS — J3081 Allergic rhinitis due to animal (cat) (dog) hair and dander: Secondary | ICD-10-CM | POA: Diagnosis not present

## 2022-02-14 DIAGNOSIS — J3081 Allergic rhinitis due to animal (cat) (dog) hair and dander: Secondary | ICD-10-CM | POA: Diagnosis not present

## 2022-02-14 DIAGNOSIS — J301 Allergic rhinitis due to pollen: Secondary | ICD-10-CM | POA: Diagnosis not present

## 2022-02-14 DIAGNOSIS — J3089 Other allergic rhinitis: Secondary | ICD-10-CM | POA: Diagnosis not present

## 2022-02-23 DIAGNOSIS — J3081 Allergic rhinitis due to animal (cat) (dog) hair and dander: Secondary | ICD-10-CM | POA: Diagnosis not present

## 2022-02-23 DIAGNOSIS — J301 Allergic rhinitis due to pollen: Secondary | ICD-10-CM | POA: Diagnosis not present

## 2022-02-23 DIAGNOSIS — J3089 Other allergic rhinitis: Secondary | ICD-10-CM | POA: Diagnosis not present

## 2022-02-28 DIAGNOSIS — J3089 Other allergic rhinitis: Secondary | ICD-10-CM | POA: Diagnosis not present

## 2022-02-28 DIAGNOSIS — J3081 Allergic rhinitis due to animal (cat) (dog) hair and dander: Secondary | ICD-10-CM | POA: Diagnosis not present

## 2022-02-28 DIAGNOSIS — J301 Allergic rhinitis due to pollen: Secondary | ICD-10-CM | POA: Diagnosis not present

## 2022-03-14 DIAGNOSIS — J3081 Allergic rhinitis due to animal (cat) (dog) hair and dander: Secondary | ICD-10-CM | POA: Diagnosis not present

## 2022-03-14 DIAGNOSIS — J301 Allergic rhinitis due to pollen: Secondary | ICD-10-CM | POA: Diagnosis not present

## 2022-03-14 DIAGNOSIS — J3089 Other allergic rhinitis: Secondary | ICD-10-CM | POA: Diagnosis not present

## 2022-03-28 DIAGNOSIS — J301 Allergic rhinitis due to pollen: Secondary | ICD-10-CM | POA: Diagnosis not present

## 2022-03-28 DIAGNOSIS — J3089 Other allergic rhinitis: Secondary | ICD-10-CM | POA: Diagnosis not present

## 2022-03-28 DIAGNOSIS — J3081 Allergic rhinitis due to animal (cat) (dog) hair and dander: Secondary | ICD-10-CM | POA: Diagnosis not present

## 2022-04-11 DIAGNOSIS — N760 Acute vaginitis: Secondary | ICD-10-CM | POA: Diagnosis not present

## 2022-04-11 DIAGNOSIS — Z124 Encounter for screening for malignant neoplasm of cervix: Secondary | ICD-10-CM | POA: Diagnosis not present

## 2022-04-11 DIAGNOSIS — Z01419 Encounter for gynecological examination (general) (routine) without abnormal findings: Secondary | ICD-10-CM | POA: Diagnosis not present

## 2022-04-11 DIAGNOSIS — Z6824 Body mass index (BMI) 24.0-24.9, adult: Secondary | ICD-10-CM | POA: Diagnosis not present

## 2022-04-11 DIAGNOSIS — J301 Allergic rhinitis due to pollen: Secondary | ICD-10-CM | POA: Diagnosis not present

## 2022-04-11 DIAGNOSIS — J3089 Other allergic rhinitis: Secondary | ICD-10-CM | POA: Diagnosis not present

## 2022-04-11 DIAGNOSIS — J3081 Allergic rhinitis due to animal (cat) (dog) hair and dander: Secondary | ICD-10-CM | POA: Diagnosis not present

## 2022-04-21 DIAGNOSIS — J3089 Other allergic rhinitis: Secondary | ICD-10-CM | POA: Diagnosis not present

## 2022-04-21 DIAGNOSIS — J3081 Allergic rhinitis due to animal (cat) (dog) hair and dander: Secondary | ICD-10-CM | POA: Diagnosis not present

## 2022-04-21 DIAGNOSIS — J301 Allergic rhinitis due to pollen: Secondary | ICD-10-CM | POA: Diagnosis not present

## 2022-05-07 NOTE — H&P (Signed)
HPI:   Toni Gordon is a 57 y.o. female who presents as a return Patient.   Current problem: Cough.  HPI: She got sick a couple of weeks ago and had a sore throat initially with cough and some congestion. Everything is better except for the cough. The cough has persisted especially at night. It keeps her up at night. She takes omeprazole every day at 4 in the afternoon. She still drinks some wine and coffee on a regular basis. She feels well otherwise but a little bit tired and worn out and not as active as usual. Her subglottic stenosis symptoms have not recurred yet. The cough has been dry.  PMH/Meds/All/SocHx/FamHx/ROS:   Past Medical History:  Diagnosis Date   Anemia   Dysphonia 12/08/2014   Tracheal stenosis 12/08/2014   Past Surgical History:  Procedure Laterality Date   microlaryngoscopy with balloon dialation   SHOULDER SURGERY Left  Ligament release   THROAT SURGERY   No family history of bleeding disorders, wound healing problems or difficulty with anesthesia.   Social History   Socioeconomic History   Marital status: Married  Spouse name: Not on file   Number of children: Not on file   Years of education: Not on file   Highest education level: Not on file  Occupational History   Not on file  Tobacco Use   Smoking status: Never Smoker   Smokeless tobacco: Never Used  Substance and Sexual Activity   Alcohol use: Yes  Alcohol/week: 5.0 standard drinks  Types: 5 Glasses of wine per week   Drug use: No   Sexual activity: Not on file  Other Topics Concern   Not on file  Social History Narrative   Not on file   Social Determinants of Health   Financial Resource Strain: Not on file  Food Insecurity: Not on file  Transportation Needs: Not on file  Physical Activity: Not on file  Stress: Not on file  Social Connections: Not on file  Housing Stability: Not on file   Current Outpatient Medications:   all-trans retinoic acid (RETIN-A) 0.05 % cream, tretinoin  0.05 % topical cream APPLY PEA SIZED AMOUNT TO FACE AT NIGHT, Disp: , Rfl:   azelastine (OPTIVAR) 0.05 % ophthalmic solution, azelastine 0.05 % eye drops INSTILL 1 DROP INTO AFFECTED EYE TWICE A DAY, Disp: , Rfl:   cetirizine (ZYRTEC) 10 MG tablet, Take by mouth., Disp: , Rfl:   cholecalciferol (VITAMIN D3) 1000 UNIT Tab, Take 1,000 Units by mouth daily., Disp: , Rfl:   dextroamphetamine-amphetamine ER (ADDERALL XR/ER) 10 MG 24 hr capsule, TAKE 1 CAPSULE BY MOUTH EVERY MORNING AS NEEDED FOR FOCUS AND CONCENTRATION, Disp: , Rfl:   EPINEPHrine (EPIPEN) 0.3 mg/0.3 mL auto-injector, as directed, Disp: , Rfl:   estradiol (VIVELLE-DOT) 0.0375 mg/24 hr, USE AS DIRECTED, Disp: , Rfl: 11  FLUoxetine (PROZAC) 10 MG capsule, TAKE 1 TO 2 CAPSULES BY MOUTH ONCE DAILY, Disp: , Rfl: 3  FLUoxetine (PROZAC) 10 MG tablet, Take by mouth., Disp: , Rfl:   levocetirizine dihydrochloride (LEVOCETIRIZINE ORAL), Take by mouth., Disp: , Rfl:   meloxicam (MOBIC) 15 MG tablet, TAKE 1 TABLET BY MOUTH EVERY DAY WITH FOOD AS NEEDED, Disp: , Rfl: 0  montelukast (SINGULAIR) 10 mg tablet, TAKE 1 TABLET BY MOUTH EVERY DAY IN THE EVENING, Disp: , Rfl: 0  multivitamin capsule, Take by mouth., Disp: , Rfl:   OMEGA-3/DHA/EPA/FISH OIL (OMEGA-3 FATTY ACIDS-FISH OIL) 300-1,000 mg capsule, Take by mouth., Disp: , Rfl:   omeprazole (  PRILOSEC) 40 MG capsule, TAKE 1 CAPSULE EVERY DAY, Disp: , Rfl: 12  progesterone (PROMETRIUM) 100 mg capsule, Take 200 mg by mouth daily., Disp: , Rfl:    Physical Exam:   Healthy-appearing lady in no distress. Breathing and voice are clear. Nasal exam is unremarkable. Oral cavity and pharynx are healthy and clear. Indirect laryngoscopy reveals healthy mobile cords without any signs of inflammation or any neoplasm. No palpable adenopathy in the neck.  Independent Review of Additional Tests or Records:  none  Procedures:  none  Impression & Plans:  Recent upper respiratory viral infection, stimulating  irritation in her throat and exacerbation by chronic reflux. Recommend she take the omeprazole early in the morning at least an hour before breakfast. Recommend she try a Pepcid or Tagamet in the evening before bedtime and use something such as Tums or Rolaids as needed when she has cough paroxysms. If possible recommend complete elimination of caffeine and alcohol for at least a few weeks until she is feeling better. Follow-up as needed.

## 2022-05-09 ENCOUNTER — Encounter (HOSPITAL_BASED_OUTPATIENT_CLINIC_OR_DEPARTMENT_OTHER): Payer: Self-pay | Admitting: Otolaryngology

## 2022-05-09 ENCOUNTER — Other Ambulatory Visit: Payer: Self-pay

## 2022-05-09 DIAGNOSIS — J3081 Allergic rhinitis due to animal (cat) (dog) hair and dander: Secondary | ICD-10-CM | POA: Diagnosis not present

## 2022-05-09 DIAGNOSIS — J301 Allergic rhinitis due to pollen: Secondary | ICD-10-CM | POA: Diagnosis not present

## 2022-05-09 DIAGNOSIS — J3089 Other allergic rhinitis: Secondary | ICD-10-CM | POA: Diagnosis not present

## 2022-05-15 NOTE — Anesthesia Preprocedure Evaluation (Signed)
Anesthesia Evaluation  Patient identified by MRN, date of birth, ID band Patient awake    Reviewed: Allergy & Precautions, NPO status , Patient's Chart, lab work & pertinent test results  History of Anesthesia Complications (+) PONVNegative for: history of anesthetic complications  Airway Mallampati: II  TM Distance: >3 FB Neck ROM: Full    Dental  (+) Dental Advisory Given, Teeth Intact   Pulmonary neg pulmonary ROS   Pulmonary exam normal        Cardiovascular negative cardio ROS Normal cardiovascular exam     Neuro/Psych negative neurological ROS     GI/Hepatic Neg liver ROS,GERD  ,,  Endo/Other  negative endocrine ROS    Renal/GU negative Renal ROS  negative genitourinary   Musculoskeletal negative musculoskeletal ROS (+)    Abdominal   Peds  Hematology negative hematology ROS (+)   Anesthesia Other Findings Idiopathic subglottic stenosis  Date/Time: 07/20/2020 8:05 AM Performed by: Lauralyn Primes, CRNA Pre-anesthesia Checklist: Patient identified, Emergency Drugs available, Suction available and Patient being monitored Patient Re-evaluated:Patient Re-evaluated prior to induction Oxygen Delivery Method: Circle system utilized Preoxygenation: Pre-oxygenation with 100% oxygen Induction Type: IV induction Ventilation: Mask ventilation without difficulty Laryngoscope Size: Mac and 3 Grade View: Grade I Tube type: Oral Tube size: 6.0 mm Number of attempts: 1 Airway Equipment and Method: Stylet and Bite block Placement Confirmation: ETT inserted through vocal cords under direct vision, positive ETCO2 and breath sounds checked- equal and bilateral Secured at: 22 cm Tube secured with: Tape Dental Injury: Teeth and Oropharynx as per pre-operative assessment      Reproductive/Obstetrics                             Anesthesia Physical Anesthesia Plan  ASA: 2  Anesthesia Plan:  General   Post-op Pain Management: Minimal or no pain anticipated, Tylenol PO (pre-op)* and Celebrex PO (pre-op)*   Induction: Intravenous  PONV Risk Score and Plan: 4 or greater and Ondansetron, Dexamethasone, Treatment may vary due to age or medical condition and TIVA  Airway Management Planned: Oral ETT  Additional Equipment: None  Intra-op Plan:   Post-operative Plan: Extubation in OR  Informed Consent: I have reviewed the patients History and Physical, chart, labs and discussed the procedure including the risks, benefits and alternatives for the proposed anesthesia with the patient or authorized representative who has indicated his/her understanding and acceptance.     Dental advisory given  Plan Discussed with: Anesthesiologist and CRNA  Anesthesia Plan Comments: (  )        Anesthesia Quick Evaluation

## 2022-05-16 ENCOUNTER — Encounter (HOSPITAL_BASED_OUTPATIENT_CLINIC_OR_DEPARTMENT_OTHER)
Admission: RE | Disposition: A | Discharge status: Home / Self Care | Payer: Self-pay | Source: Home / Self Care | Attending: Otolaryngology

## 2022-05-16 ENCOUNTER — Ambulatory Visit (HOSPITAL_COMMUNITY)
Admission: RE | Admit: 2022-05-16 | Discharge: 2022-05-16 | Disposition: A | Discharge status: Home / Self Care | Payer: BC Managed Care – PPO | Attending: Otolaryngology | Admitting: Otolaryngology

## 2022-05-16 ENCOUNTER — Ambulatory Visit (HOSPITAL_BASED_OUTPATIENT_CLINIC_OR_DEPARTMENT_OTHER): Payer: BC Managed Care – PPO | Admitting: Anesthesiology

## 2022-05-16 ENCOUNTER — Encounter (HOSPITAL_BASED_OUTPATIENT_CLINIC_OR_DEPARTMENT_OTHER): Payer: Self-pay | Admitting: Otolaryngology

## 2022-05-16 ENCOUNTER — Other Ambulatory Visit: Payer: Self-pay

## 2022-05-16 DIAGNOSIS — K219 Gastro-esophageal reflux disease without esophagitis: Secondary | ICD-10-CM | POA: Diagnosis not present

## 2022-05-16 DIAGNOSIS — J0391 Acute recurrent tonsillitis, unspecified: Secondary | ICD-10-CM | POA: Diagnosis not present

## 2022-05-16 DIAGNOSIS — Z01818 Encounter for other preprocedural examination: Secondary | ICD-10-CM

## 2022-05-16 DIAGNOSIS — J398 Other specified diseases of upper respiratory tract: Secondary | ICD-10-CM | POA: Diagnosis not present

## 2022-05-16 DIAGNOSIS — Z79899 Other long term (current) drug therapy: Secondary | ICD-10-CM | POA: Insufficient documentation

## 2022-05-16 DIAGNOSIS — R490 Dysphonia: Secondary | ICD-10-CM | POA: Diagnosis not present

## 2022-05-16 DIAGNOSIS — J386 Stenosis of larynx: Secondary | ICD-10-CM | POA: Insufficient documentation

## 2022-05-16 DIAGNOSIS — R0683 Snoring: Secondary | ICD-10-CM | POA: Diagnosis not present

## 2022-05-16 DIAGNOSIS — R059 Cough, unspecified: Secondary | ICD-10-CM | POA: Diagnosis not present

## 2022-05-16 HISTORY — PX: MICROLARYNGOSCOPY WITH LASER AND BALLOON DILATION: SHX5973

## 2022-05-16 SURGERY — MICROLARYNGOSCOPY WITH LASER AND BALLOON DILATION
Anesthesia: General | Site: Throat

## 2022-05-16 MED ORDER — CELECOXIB 200 MG PO CAPS
ORAL_CAPSULE | ORAL | Status: AC
  Filled 2022-05-16: qty 1

## 2022-05-16 MED ORDER — PROPOFOL 10 MG/ML IV BOLUS
INTRAVENOUS | Status: DC | PRN
  Administered 2022-05-16: 50 mg via INTRAVENOUS
  Administered 2022-05-16: 150 mg via INTRAVENOUS

## 2022-05-16 MED ORDER — MIDAZOLAM HCL 5 MG/5ML IJ SOLN
INTRAMUSCULAR | Status: DC | PRN
  Administered 2022-05-16: 2 mg via INTRAVENOUS

## 2022-05-16 MED ORDER — ACETAMINOPHEN 325 MG PO TABS: 325.0000 mg | ORAL_TABLET | ORAL | Status: DC | PRN

## 2022-05-16 MED ORDER — LIDOCAINE 2% (20 MG/ML) 5 ML SYRINGE
INTRAMUSCULAR | Status: AC
  Filled 2022-05-16: qty 5

## 2022-05-16 MED ORDER — SUGAMMADEX SODIUM 200 MG/2ML IV SOLN
INTRAVENOUS | Status: DC | PRN
  Administered 2022-05-16: 200 mg via INTRAVENOUS

## 2022-05-16 MED ORDER — ONDANSETRON HCL 4 MG/2ML IJ SOLN
INTRAMUSCULAR | Status: AC
  Filled 2022-05-16: qty 2

## 2022-05-16 MED ORDER — DEXAMETHASONE SODIUM PHOSPHATE 4 MG/ML IJ SOLN
INTRAMUSCULAR | Status: DC | PRN
  Administered 2022-05-16: 10 mg via INTRAVENOUS

## 2022-05-16 MED ORDER — ACETAMINOPHEN 500 MG PO TABS
1000.0000 mg | ORAL_TABLET | Freq: Once | ORAL | Status: AC
  Administered 2022-05-16: 1000 mg via ORAL

## 2022-05-16 MED ORDER — FENTANYL CITRATE (PF) 100 MCG/2ML IJ SOLN
25.0000 ug | INTRAMUSCULAR | Status: DC | PRN
  Administered 2022-05-16 (×2): 25 ug via INTRAVENOUS

## 2022-05-16 MED ORDER — ACETAMINOPHEN 500 MG PO TABS
ORAL_TABLET | ORAL | Status: AC
  Filled 2022-05-16: qty 2

## 2022-05-16 MED ORDER — DEXAMETHASONE SODIUM PHOSPHATE 10 MG/ML IJ SOLN
INTRAMUSCULAR | Status: AC
  Filled 2022-05-16: qty 1

## 2022-05-16 MED ORDER — OXYCODONE HCL 5 MG/5ML PO SOLN: 5.0000 mg | Freq: Once | ORAL | Status: AC | PRN

## 2022-05-16 MED ORDER — ONDANSETRON HCL 4 MG/2ML IJ SOLN
INTRAMUSCULAR | Status: DC | PRN
  Administered 2022-05-16: 4 mg via INTRAVENOUS

## 2022-05-16 MED ORDER — ROCURONIUM BROMIDE 10 MG/ML (PF) SYRINGE
PREFILLED_SYRINGE | INTRAVENOUS | Status: AC
  Filled 2022-05-16: qty 10

## 2022-05-16 MED ORDER — CELECOXIB 200 MG PO CAPS
200.0000 mg | ORAL_CAPSULE | Freq: Once | ORAL | Status: AC
  Administered 2022-05-16: 200 mg via ORAL

## 2022-05-16 MED ORDER — LACTATED RINGERS IV SOLN: INTRAVENOUS | Status: DC

## 2022-05-16 MED ORDER — FENTANYL CITRATE (PF) 100 MCG/2ML IJ SOLN
INTRAMUSCULAR | Status: AC
  Filled 2022-05-16: qty 2

## 2022-05-16 MED ORDER — METHYLENE BLUE 1 % INJ SOLN
INTRAVENOUS | Status: AC
  Filled 2022-05-16: qty 10

## 2022-05-16 MED ORDER — OXYCODONE HCL 5 MG PO TABS
5.0000 mg | ORAL_TABLET | Freq: Once | ORAL | Status: AC | PRN
  Administered 2022-05-16: 5 mg via ORAL

## 2022-05-16 MED ORDER — ROCURONIUM BROMIDE 100 MG/10ML IV SOLN
INTRAVENOUS | Status: DC | PRN
  Administered 2022-05-16: 70 mg via INTRAVENOUS

## 2022-05-16 MED ORDER — PROPOFOL 500 MG/50ML IV EMUL
INTRAVENOUS | Status: DC | PRN
  Administered 2022-05-16: 200 ug/kg/min via INTRAVENOUS

## 2022-05-16 MED ORDER — MEPERIDINE HCL 25 MG/ML IJ SOLN: 6.2500 mg | INTRAMUSCULAR | Status: DC | PRN

## 2022-05-16 MED ORDER — LIDOCAINE HCL (CARDIAC) PF 100 MG/5ML IV SOSY
PREFILLED_SYRINGE | INTRAVENOUS | Status: DC | PRN
  Administered 2022-05-16: 60 mg via INTRAVENOUS

## 2022-05-16 MED ORDER — ACETAMINOPHEN 160 MG/5ML PO SOLN: 325.0000 mg | ORAL | Status: DC | PRN

## 2022-05-16 MED ORDER — FENTANYL CITRATE (PF) 100 MCG/2ML IJ SOLN
INTRAMUSCULAR | Status: DC | PRN
  Administered 2022-05-16: 50 ug via INTRAVENOUS

## 2022-05-16 MED ORDER — EPINEPHRINE PF 1 MG/ML IJ SOLN
INTRAMUSCULAR | Status: AC
  Filled 2022-05-16: qty 2

## 2022-05-16 MED ORDER — OXYCODONE HCL 5 MG PO TABS
ORAL_TABLET | ORAL | Status: AC
  Filled 2022-05-16: qty 1

## 2022-05-16 MED ORDER — MIDAZOLAM HCL 2 MG/2ML IJ SOLN
INTRAMUSCULAR | Status: AC
  Filled 2022-05-16: qty 2

## 2022-05-16 MED ORDER — ONDANSETRON HCL 4 MG/2ML IJ SOLN: 4.0000 mg | Freq: Once | INTRAMUSCULAR | Status: DC | PRN

## 2022-05-16 MED ORDER — PROPOFOL 10 MG/ML IV BOLUS
INTRAVENOUS | Status: AC
  Filled 2022-05-16: qty 20

## 2022-05-16 SURGICAL SUPPLY — 33 items
BALLN PULM 12 13.5 15X75 (BALLOONS) ×1
BALLN PULM 15 16.5 18X75 (BALLOONS)
BALLN PULMONARY 10-12 (MISCELLANEOUS) IMPLANT
BALLN PULMONARY 8-10 OD75 (MISCELLANEOUS) IMPLANT
BALLOON PULM 12 13.5 15X75 (BALLOONS) IMPLANT
BALLOON PULM 15 16.5 18X75 (BALLOONS) IMPLANT
BNDG EYE OVAL 2 1/8 X 2 5/8 (GAUZE/BANDAGES/DRESSINGS) ×2 IMPLANT
CANISTER SUCT 1200ML W/VALVE (MISCELLANEOUS) ×1 IMPLANT
DEFOGGER MIRROR 1QT (MISCELLANEOUS) IMPLANT
DEPRESSOR TONGUE 6 IN STERILE (GAUZE/BANDAGES/DRESSINGS) ×1 IMPLANT
GAUZE SPONGE 4X4 12PLY STRL LF (GAUZE/BANDAGES/DRESSINGS) ×2 IMPLANT
GLOVE BIOGEL PI IND STRL 7.5 (GLOVE) IMPLANT
GLOVE ECLIPSE 7.5 STRL STRAW (GLOVE) ×1 IMPLANT
GOWN STRL REUS W/ TWL LRG LVL3 (GOWN DISPOSABLE) IMPLANT
GOWN STRL REUS W/ TWL XL LVL3 (GOWN DISPOSABLE) IMPLANT
GOWN STRL REUS W/TWL LRG LVL3 (GOWN DISPOSABLE)
GOWN STRL REUS W/TWL XL LVL3 (GOWN DISPOSABLE) ×2
GUARD TEETH (MISCELLANEOUS) IMPLANT
MARKER SKIN DUAL TIP RULER LAB (MISCELLANEOUS) IMPLANT
NDL SPNL 22GX7 QUINCKE BK (NEEDLE) IMPLANT
NEEDLE SPNL 22GX7 QUINCKE BK (NEEDLE) IMPLANT
NS IRRIG 1000ML POUR BTL (IV SOLUTION) ×1 IMPLANT
PACK BASIN DAY SURGERY FS (CUSTOM PROCEDURE TRAY) ×1 IMPLANT
PATTIES SURGICAL .5 X3 (DISPOSABLE) IMPLANT
SHEET MEDIUM DRAPE 40X70 STRL (DRAPES) ×1 IMPLANT
SLEEVE SCD COMPRESS KNEE MED (STOCKING) IMPLANT
SURGILUBE 2OZ TUBE FLIPTOP (MISCELLANEOUS) IMPLANT
SYR 5ML LL (SYRINGE) IMPLANT
SYR CONTROL 10ML LL (SYRINGE) IMPLANT
SYR GAUGE ASSEMBLY ALLIANCE II (MISCELLANEOUS) IMPLANT
SYR TB 1ML LL NO SAFETY (SYRINGE) IMPLANT
TOWEL GREEN STERILE FF (TOWEL DISPOSABLE) ×1 IMPLANT
TUBE CONNECTING 20X1/4 (TUBING) ×1 IMPLANT

## 2022-05-16 NOTE — Anesthesia Postprocedure Evaluation (Signed)
Anesthesia Post Note  Patient: Toni Gordon  Procedure(s) Performed: MICROLARYNGOSCOPY WITH CO2 LASER AND BALLOON DILATION (Throat)     Patient location during evaluation: PACU Anesthesia Type: General Level of consciousness: awake and alert Pain management: pain level controlled Vital Signs Assessment: post-procedure vital signs reviewed and stable Respiratory status: spontaneous breathing, nonlabored ventilation, respiratory function stable and patient connected to nasal cannula oxygen Cardiovascular status: blood pressure returned to baseline and stable Postop Assessment: no apparent nausea or vomiting Anesthetic complications: no   No notable events documented.  Last Vitals:  Vitals:   05/16/22 1030 05/16/22 1110  BP: (!) 142/83 135/70  Pulse: (!) 59 (!) 56  Resp: 14 16  Temp:  (!) 36.2 C  SpO2: 100% 97%    Last Pain:  Vitals:   05/16/22 1030  TempSrc:   PainSc: 5                  Katryn Plummer

## 2022-05-16 NOTE — Anesthesia Procedure Notes (Signed)
Procedure Name: Intubation Date/Time: 05/16/2022 10:15 AM  Performed by: Karen Kitchens, CRNAPre-anesthesia Checklist: Patient identified, Emergency Drugs available, Suction available and Patient being monitored Patient Re-evaluated:Patient Re-evaluated prior to induction Oxygen Delivery Method: Circle system utilized Preoxygenation: Pre-oxygenation with 100% oxygen Induction Type: IV induction Ventilation: Mask ventilation without difficulty Laryngoscope Size: Mac and 4 Grade View: Grade I Tube type: Oral Tube size: 7.0 mm Number of attempts: 1 Airway Equipment and Method: Stylet and Oral airway Placement Confirmation: ETT inserted through vocal cords under direct vision, positive ETCO2, breath sounds checked- equal and bilateral and CO2 detector Secured at: 22 cm Tube secured with: Tape Dental Injury: Teeth and Oropharynx as per pre-operative assessment

## 2022-05-16 NOTE — Interval H&P Note (Signed)
History and Physical Interval Note:  05/16/2022 8:32 AM  Toni Gordon  has presented today for surgery, with the diagnosis of Tracheal stenosis; Dysphonia; Laryngopharyngeal reflux.  The various methods of treatment have been discussed with the patient and family. After consideration of risks, benefits and other options for treatment, the patient has consented to  Procedure(s): MICROLARYNGOSCOPY WITH CO2 LASER AND BALLOON DILATION (N/A) as a surgical intervention.  The patient's history has been reviewed, patient examined, no change in status, stable for surgery.  I have reviewed the patient's chart and labs.  Questions were answered to the patient's satisfaction.     [name]

## 2022-05-16 NOTE — Op Note (Signed)
  OPERATIVE REPORT   DATE OF SURGERY: 07/20/2020   PATIENT:  Toni Gordon,  57 y.o. female   PRE-OPERATIVE DIAGNOSIS: Idiopathic subglottic stenosis   POST-OPERATIVE DIAGNOSIS: Idiopathic subglottic stenosis   PROCEDURE:  Procedure(s): MICROLARYNGOSCOPY WITH LASER AND BALLOON DILATION   SURGEON:  Susy Frizzle, MD   ASSISTANTS: None   ANESTHESIA:   General    EBL: Less than 10 ml   DRAINS: None   LOCAL MEDICATIONS USED:  None   SPECIMEN:  none   COUNTS:  Correct   PROCEDURE DETAILS: The patient was taken to the operating room and placed on the operating table in the supine position. Following induction of general endotracheal anesthesia, using a #6 endotracheal tube, the table was turned 90 degrees and the patient was draped in standard fashion.  A maxillary tooth protector was used throughout the case.  The operating microscope was positioned.  A Jako laryngoscope was entered into the oral cavity and used to visualize the larynx.  This was attached to the Mayo stand with the suspension apparatus.  The operating microscope was then brought into view.  The endotracheal tube was removed and then replaced through the laryngoscope.  For the remainder of the case the endotracheal tube was removed as needed and then replaced.  At the end of the case a #7 endotracheal tube was placed without difficulty.  Saline soaked eye pads and saline soaked towels were placed for laser protection around the eyes and the face respectively.  The subglottic larynx was inspected and there was a circumferential stenotic segment mainly involving from approximately 9:00 to approximately 5:00 going clockwise.  The carbon oxide laser was set at 3 W continuous power and radial cuts were created at 9:00 12:00 10:00 2:00 and 4:00.  The BRE balloon dilator was used at a pressure of 4.5 atm corresponding to 13.5 mm.  This was done twice.  Each time for 30 seconds.  Secretions were suctioned.  The #7 endotracheal tube  was replaced and the care of the patient was taken back to anesthesia for extubation.       PATIENT DISPOSITION:  To PACU, stable

## 2022-05-16 NOTE — Discharge Instructions (Addendum)
  Post Anesthesia Home Care Instructions  Activity: Get plenty of rest for the remainder of the day. A responsible individual must stay with you for 24 hours following the procedure.  For the next 24 hours, DO NOT: -Drive a car -Advertising copywriter -Drink alcoholic beverages -Take any medication unless instructed by your physician -Make any legal decisions or sign important papers.  Meals: Start with liquid foods such as gelatin or soup. Progress to regular foods as tolerated. Avoid greasy, spicy, heavy foods. If nausea and/or vomiting occur, drink only clear liquids until the nausea and/or vomiting subsides. Call your physician if vomiting continues.  Special Instructions/Symptoms: Your throat may feel dry or sore from the anesthesia or the breathing tube placed in your throat during surgery. If this causes discomfort, gargle with warm salt water. The discomfort should disappear within 24 hours.  If you had a scopolamine patch placed behind your ear for the management of post- operative nausea and/or vomiting:  1. The medication in the patch is effective for 72 hours, after which it should be removed.  Wrap patch in a tissue and discard in the trash. Wash hands thoroughly with soap and water. 2. You may remove the patch earlier than 72 hours if you experience unpleasant side effects which may include dry mouth, dizziness or visual disturbances. 3. Avoid touching the patch. Wash your hands with soap and water after contact with the patch.   No tylenol or ibuprofen until after 2:45pm today if needed.

## 2022-05-16 NOTE — Transfer of Care (Signed)
Immediate Anesthesia Transfer of Care Note  Patient: KYSA HECKMANN  Procedure(s) Performed: MICROLARYNGOSCOPY WITH CO2 LASER AND BALLOON DILATION (Throat)  Patient Location: PACU  Anesthesia Type:General  Level of Consciousness: awake, drowsy, and patient cooperative  Airway & Oxygen Therapy: Patient Spontanous Breathing and Patient connected to face mask oxygen  Post-op Assessment: Report given to RN and Post -op Vital signs reviewed and stable  Post vital signs: Reviewed and stable  Last Vitals:  Vitals Value Taken Time  BP    Temp    Pulse 70 05/16/22 1013  Resp 20 05/16/22 1013  SpO2 100 % 05/16/22 1013    Last Pain:  Vitals:   05/16/22 0844  TempSrc: Oral  PainSc: 0-No pain         Complications: No notable events documented.

## 2022-05-17 ENCOUNTER — Encounter (HOSPITAL_BASED_OUTPATIENT_CLINIC_OR_DEPARTMENT_OTHER): Payer: Self-pay | Admitting: Otolaryngology

## 2022-05-23 DIAGNOSIS — J3081 Allergic rhinitis due to animal (cat) (dog) hair and dander: Secondary | ICD-10-CM | POA: Diagnosis not present

## 2022-05-23 DIAGNOSIS — J301 Allergic rhinitis due to pollen: Secondary | ICD-10-CM | POA: Diagnosis not present

## 2022-05-23 DIAGNOSIS — J3089 Other allergic rhinitis: Secondary | ICD-10-CM | POA: Diagnosis not present

## 2022-05-26 DIAGNOSIS — E7800 Pure hypercholesterolemia, unspecified: Secondary | ICD-10-CM | POA: Diagnosis not present

## 2022-05-26 DIAGNOSIS — Z131 Encounter for screening for diabetes mellitus: Secondary | ICD-10-CM | POA: Diagnosis not present

## 2022-05-26 DIAGNOSIS — Z0000 Encounter for general adult medical examination without abnormal findings: Secondary | ICD-10-CM | POA: Diagnosis not present

## 2022-05-27 DIAGNOSIS — J301 Allergic rhinitis due to pollen: Secondary | ICD-10-CM | POA: Diagnosis not present

## 2022-05-27 DIAGNOSIS — J3081 Allergic rhinitis due to animal (cat) (dog) hair and dander: Secondary | ICD-10-CM | POA: Diagnosis not present

## 2022-05-27 DIAGNOSIS — H1045 Other chronic allergic conjunctivitis: Secondary | ICD-10-CM | POA: Diagnosis not present

## 2022-05-27 DIAGNOSIS — J3089 Other allergic rhinitis: Secondary | ICD-10-CM | POA: Diagnosis not present

## 2022-06-09 DIAGNOSIS — A084 Viral intestinal infection, unspecified: Secondary | ICD-10-CM | POA: Diagnosis not present

## 2022-06-14 DIAGNOSIS — J3089 Other allergic rhinitis: Secondary | ICD-10-CM | POA: Diagnosis not present

## 2022-06-14 DIAGNOSIS — J3081 Allergic rhinitis due to animal (cat) (dog) hair and dander: Secondary | ICD-10-CM | POA: Diagnosis not present

## 2022-06-14 DIAGNOSIS — J301 Allergic rhinitis due to pollen: Secondary | ICD-10-CM | POA: Diagnosis not present

## 2022-06-15 DIAGNOSIS — Z1231 Encounter for screening mammogram for malignant neoplasm of breast: Secondary | ICD-10-CM | POA: Diagnosis not present

## 2022-06-20 ENCOUNTER — Other Ambulatory Visit: Payer: Self-pay | Admitting: Obstetrics and Gynecology

## 2022-06-20 DIAGNOSIS — R928 Other abnormal and inconclusive findings on diagnostic imaging of breast: Secondary | ICD-10-CM

## 2022-06-22 ENCOUNTER — Ambulatory Visit
Admission: RE | Admit: 2022-06-22 | Discharge: 2022-06-22 | Disposition: A | Discharge status: Home / Self Care | Payer: BC Managed Care – PPO | Source: Ambulatory Visit | Attending: Obstetrics and Gynecology | Admitting: Obstetrics and Gynecology

## 2022-06-22 DIAGNOSIS — J3089 Other allergic rhinitis: Secondary | ICD-10-CM | POA: Diagnosis not present

## 2022-06-22 DIAGNOSIS — J3081 Allergic rhinitis due to animal (cat) (dog) hair and dander: Secondary | ICD-10-CM | POA: Diagnosis not present

## 2022-06-22 DIAGNOSIS — R928 Other abnormal and inconclusive findings on diagnostic imaging of breast: Secondary | ICD-10-CM

## 2022-06-22 DIAGNOSIS — N6002 Solitary cyst of left breast: Secondary | ICD-10-CM | POA: Diagnosis not present

## 2022-06-22 DIAGNOSIS — J301 Allergic rhinitis due to pollen: Secondary | ICD-10-CM | POA: Diagnosis not present

## 2022-07-27 DIAGNOSIS — J3081 Allergic rhinitis due to animal (cat) (dog) hair and dander: Secondary | ICD-10-CM | POA: Diagnosis not present

## 2022-07-27 DIAGNOSIS — J301 Allergic rhinitis due to pollen: Secondary | ICD-10-CM | POA: Diagnosis not present

## 2022-07-27 DIAGNOSIS — J3089 Other allergic rhinitis: Secondary | ICD-10-CM | POA: Diagnosis not present

## 2022-08-08 DIAGNOSIS — J3089 Other allergic rhinitis: Secondary | ICD-10-CM | POA: Diagnosis not present

## 2022-08-08 DIAGNOSIS — J301 Allergic rhinitis due to pollen: Secondary | ICD-10-CM | POA: Diagnosis not present

## 2022-08-08 DIAGNOSIS — J3081 Allergic rhinitis due to animal (cat) (dog) hair and dander: Secondary | ICD-10-CM | POA: Diagnosis not present

## 2022-09-14 DIAGNOSIS — J301 Allergic rhinitis due to pollen: Secondary | ICD-10-CM | POA: Diagnosis not present

## 2022-09-14 DIAGNOSIS — J3081 Allergic rhinitis due to animal (cat) (dog) hair and dander: Secondary | ICD-10-CM | POA: Diagnosis not present

## 2022-09-14 DIAGNOSIS — J3089 Other allergic rhinitis: Secondary | ICD-10-CM | POA: Diagnosis not present

## 2022-09-20 DIAGNOSIS — J301 Allergic rhinitis due to pollen: Secondary | ICD-10-CM | POA: Diagnosis not present

## 2022-09-20 DIAGNOSIS — J3081 Allergic rhinitis due to animal (cat) (dog) hair and dander: Secondary | ICD-10-CM | POA: Diagnosis not present

## 2022-09-20 DIAGNOSIS — J3089 Other allergic rhinitis: Secondary | ICD-10-CM | POA: Diagnosis not present

## 2022-10-17 DIAGNOSIS — J301 Allergic rhinitis due to pollen: Secondary | ICD-10-CM | POA: Diagnosis not present

## 2022-10-17 DIAGNOSIS — J3089 Other allergic rhinitis: Secondary | ICD-10-CM | POA: Diagnosis not present

## 2022-10-17 DIAGNOSIS — Z23 Encounter for immunization: Secondary | ICD-10-CM | POA: Diagnosis not present

## 2022-10-17 DIAGNOSIS — J3081 Allergic rhinitis due to animal (cat) (dog) hair and dander: Secondary | ICD-10-CM | POA: Diagnosis not present

## 2022-11-15 DIAGNOSIS — J301 Allergic rhinitis due to pollen: Secondary | ICD-10-CM | POA: Diagnosis not present

## 2022-11-16 DIAGNOSIS — J301 Allergic rhinitis due to pollen: Secondary | ICD-10-CM | POA: Diagnosis not present

## 2022-11-16 DIAGNOSIS — J3089 Other allergic rhinitis: Secondary | ICD-10-CM | POA: Diagnosis not present

## 2022-11-16 DIAGNOSIS — J3081 Allergic rhinitis due to animal (cat) (dog) hair and dander: Secondary | ICD-10-CM | POA: Diagnosis not present

## 2022-11-22 DIAGNOSIS — J3081 Allergic rhinitis due to animal (cat) (dog) hair and dander: Secondary | ICD-10-CM | POA: Diagnosis not present

## 2022-11-22 DIAGNOSIS — J301 Allergic rhinitis due to pollen: Secondary | ICD-10-CM | POA: Diagnosis not present

## 2022-11-22 DIAGNOSIS — J3089 Other allergic rhinitis: Secondary | ICD-10-CM | POA: Diagnosis not present

## 2022-12-05 DIAGNOSIS — J3089 Other allergic rhinitis: Secondary | ICD-10-CM | POA: Diagnosis not present

## 2022-12-05 DIAGNOSIS — J301 Allergic rhinitis due to pollen: Secondary | ICD-10-CM | POA: Diagnosis not present

## 2022-12-05 DIAGNOSIS — J3081 Allergic rhinitis due to animal (cat) (dog) hair and dander: Secondary | ICD-10-CM | POA: Diagnosis not present

## 2022-12-12 DIAGNOSIS — J3089 Other allergic rhinitis: Secondary | ICD-10-CM | POA: Diagnosis not present

## 2022-12-12 DIAGNOSIS — J301 Allergic rhinitis due to pollen: Secondary | ICD-10-CM | POA: Diagnosis not present

## 2022-12-12 DIAGNOSIS — J3081 Allergic rhinitis due to animal (cat) (dog) hair and dander: Secondary | ICD-10-CM | POA: Diagnosis not present

## 2022-12-19 DIAGNOSIS — J301 Allergic rhinitis due to pollen: Secondary | ICD-10-CM | POA: Diagnosis not present

## 2022-12-19 DIAGNOSIS — J3089 Other allergic rhinitis: Secondary | ICD-10-CM | POA: Diagnosis not present

## 2022-12-19 DIAGNOSIS — J3081 Allergic rhinitis due to animal (cat) (dog) hair and dander: Secondary | ICD-10-CM | POA: Diagnosis not present

## 2022-12-28 DIAGNOSIS — J301 Allergic rhinitis due to pollen: Secondary | ICD-10-CM | POA: Diagnosis not present

## 2022-12-28 DIAGNOSIS — J3089 Other allergic rhinitis: Secondary | ICD-10-CM | POA: Diagnosis not present

## 2022-12-28 DIAGNOSIS — J3081 Allergic rhinitis due to animal (cat) (dog) hair and dander: Secondary | ICD-10-CM | POA: Diagnosis not present

## 2023-01-02 DIAGNOSIS — J3081 Allergic rhinitis due to animal (cat) (dog) hair and dander: Secondary | ICD-10-CM | POA: Diagnosis not present

## 2023-01-02 DIAGNOSIS — J301 Allergic rhinitis due to pollen: Secondary | ICD-10-CM | POA: Diagnosis not present

## 2023-01-02 DIAGNOSIS — J3089 Other allergic rhinitis: Secondary | ICD-10-CM | POA: Diagnosis not present

## 2023-02-01 DIAGNOSIS — J3081 Allergic rhinitis due to animal (cat) (dog) hair and dander: Secondary | ICD-10-CM | POA: Diagnosis not present

## 2023-02-01 DIAGNOSIS — J3089 Other allergic rhinitis: Secondary | ICD-10-CM | POA: Diagnosis not present

## 2023-02-01 DIAGNOSIS — J301 Allergic rhinitis due to pollen: Secondary | ICD-10-CM | POA: Diagnosis not present

## 2023-02-23 DIAGNOSIS — D2362 Other benign neoplasm of skin of left upper limb, including shoulder: Secondary | ICD-10-CM | POA: Diagnosis not present

## 2023-02-23 DIAGNOSIS — L814 Other melanin hyperpigmentation: Secondary | ICD-10-CM | POA: Diagnosis not present

## 2023-02-23 DIAGNOSIS — D225 Melanocytic nevi of trunk: Secondary | ICD-10-CM | POA: Diagnosis not present

## 2023-02-27 DIAGNOSIS — J3089 Other allergic rhinitis: Secondary | ICD-10-CM | POA: Diagnosis not present

## 2023-02-27 DIAGNOSIS — J301 Allergic rhinitis due to pollen: Secondary | ICD-10-CM | POA: Diagnosis not present

## 2023-02-27 DIAGNOSIS — J3081 Allergic rhinitis due to animal (cat) (dog) hair and dander: Secondary | ICD-10-CM | POA: Diagnosis not present

## 2023-03-06 DIAGNOSIS — H6691 Otitis media, unspecified, right ear: Secondary | ICD-10-CM | POA: Diagnosis not present

## 2023-03-06 DIAGNOSIS — J988 Other specified respiratory disorders: Secondary | ICD-10-CM | POA: Diagnosis not present

## 2023-03-16 DIAGNOSIS — M7918 Myalgia, other site: Secondary | ICD-10-CM | POA: Diagnosis not present

## 2023-03-16 DIAGNOSIS — M542 Cervicalgia: Secondary | ICD-10-CM | POA: Diagnosis not present

## 2023-03-22 DIAGNOSIS — M4004 Postural kyphosis, thoracic region: Secondary | ICD-10-CM | POA: Diagnosis not present

## 2023-03-22 DIAGNOSIS — M6281 Muscle weakness (generalized): Secondary | ICD-10-CM | POA: Diagnosis not present

## 2023-03-23 DIAGNOSIS — J3081 Allergic rhinitis due to animal (cat) (dog) hair and dander: Secondary | ICD-10-CM | POA: Diagnosis not present

## 2023-03-23 DIAGNOSIS — J301 Allergic rhinitis due to pollen: Secondary | ICD-10-CM | POA: Diagnosis not present

## 2023-03-23 DIAGNOSIS — J3089 Other allergic rhinitis: Secondary | ICD-10-CM | POA: Diagnosis not present

## 2023-04-05 DIAGNOSIS — M6281 Muscle weakness (generalized): Secondary | ICD-10-CM | POA: Diagnosis not present

## 2023-04-05 DIAGNOSIS — M4004 Postural kyphosis, thoracic region: Secondary | ICD-10-CM | POA: Diagnosis not present

## 2023-04-18 DIAGNOSIS — J3089 Other allergic rhinitis: Secondary | ICD-10-CM | POA: Diagnosis not present

## 2023-04-18 DIAGNOSIS — J301 Allergic rhinitis due to pollen: Secondary | ICD-10-CM | POA: Diagnosis not present

## 2023-04-18 DIAGNOSIS — J3081 Allergic rhinitis due to animal (cat) (dog) hair and dander: Secondary | ICD-10-CM | POA: Diagnosis not present

## 2023-05-17 DIAGNOSIS — J301 Allergic rhinitis due to pollen: Secondary | ICD-10-CM | POA: Diagnosis not present

## 2023-05-17 DIAGNOSIS — J3089 Other allergic rhinitis: Secondary | ICD-10-CM | POA: Diagnosis not present

## 2023-05-17 DIAGNOSIS — J3081 Allergic rhinitis due to animal (cat) (dog) hair and dander: Secondary | ICD-10-CM | POA: Diagnosis not present

## 2023-05-30 DIAGNOSIS — J3081 Allergic rhinitis due to animal (cat) (dog) hair and dander: Secondary | ICD-10-CM | POA: Diagnosis not present

## 2023-05-30 DIAGNOSIS — J3089 Other allergic rhinitis: Secondary | ICD-10-CM | POA: Diagnosis not present

## 2023-05-30 DIAGNOSIS — J301 Allergic rhinitis due to pollen: Secondary | ICD-10-CM | POA: Diagnosis not present

## 2023-05-30 DIAGNOSIS — H1045 Other chronic allergic conjunctivitis: Secondary | ICD-10-CM | POA: Diagnosis not present

## 2023-06-14 DIAGNOSIS — J3081 Allergic rhinitis due to animal (cat) (dog) hair and dander: Secondary | ICD-10-CM | POA: Diagnosis not present

## 2023-06-14 DIAGNOSIS — J301 Allergic rhinitis due to pollen: Secondary | ICD-10-CM | POA: Diagnosis not present

## 2023-06-14 DIAGNOSIS — J3089 Other allergic rhinitis: Secondary | ICD-10-CM | POA: Diagnosis not present

## 2023-07-11 DIAGNOSIS — J3081 Allergic rhinitis due to animal (cat) (dog) hair and dander: Secondary | ICD-10-CM | POA: Diagnosis not present

## 2023-07-11 DIAGNOSIS — J3089 Other allergic rhinitis: Secondary | ICD-10-CM | POA: Diagnosis not present

## 2023-07-11 DIAGNOSIS — J301 Allergic rhinitis due to pollen: Secondary | ICD-10-CM | POA: Diagnosis not present

## 2023-08-08 DIAGNOSIS — J3081 Allergic rhinitis due to animal (cat) (dog) hair and dander: Secondary | ICD-10-CM | POA: Diagnosis not present

## 2023-08-08 DIAGNOSIS — J301 Allergic rhinitis due to pollen: Secondary | ICD-10-CM | POA: Diagnosis not present

## 2023-08-08 DIAGNOSIS — J3089 Other allergic rhinitis: Secondary | ICD-10-CM | POA: Diagnosis not present

## 2023-08-31 DIAGNOSIS — J3089 Other allergic rhinitis: Secondary | ICD-10-CM | POA: Diagnosis not present

## 2023-08-31 DIAGNOSIS — J301 Allergic rhinitis due to pollen: Secondary | ICD-10-CM | POA: Diagnosis not present

## 2023-08-31 DIAGNOSIS — J3081 Allergic rhinitis due to animal (cat) (dog) hair and dander: Secondary | ICD-10-CM | POA: Diagnosis not present

## 2023-09-19 DIAGNOSIS — M5441 Lumbago with sciatica, right side: Secondary | ICD-10-CM | POA: Diagnosis not present

## 2023-09-19 DIAGNOSIS — Z0000 Encounter for general adult medical examination without abnormal findings: Secondary | ICD-10-CM | POA: Diagnosis not present

## 2023-09-19 DIAGNOSIS — E7800 Pure hypercholesterolemia, unspecified: Secondary | ICD-10-CM | POA: Diagnosis not present

## 2023-09-19 DIAGNOSIS — M7701 Medial epicondylitis, right elbow: Secondary | ICD-10-CM | POA: Diagnosis not present

## 2023-09-19 DIAGNOSIS — N951 Menopausal and female climacteric states: Secondary | ICD-10-CM | POA: Diagnosis not present

## 2023-09-19 DIAGNOSIS — J301 Allergic rhinitis due to pollen: Secondary | ICD-10-CM | POA: Diagnosis not present

## 2023-09-19 DIAGNOSIS — K219 Gastro-esophageal reflux disease without esophagitis: Secondary | ICD-10-CM | POA: Diagnosis not present

## 2023-09-20 DIAGNOSIS — Z01419 Encounter for gynecological examination (general) (routine) without abnormal findings: Secondary | ICD-10-CM | POA: Diagnosis not present

## 2023-09-20 DIAGNOSIS — Z6823 Body mass index (BMI) 23.0-23.9, adult: Secondary | ICD-10-CM | POA: Diagnosis not present

## 2023-09-20 DIAGNOSIS — Z13820 Encounter for screening for osteoporosis: Secondary | ICD-10-CM | POA: Diagnosis not present

## 2023-09-20 DIAGNOSIS — Z124 Encounter for screening for malignant neoplasm of cervix: Secondary | ICD-10-CM | POA: Diagnosis not present

## 2023-09-20 DIAGNOSIS — Z1231 Encounter for screening mammogram for malignant neoplasm of breast: Secondary | ICD-10-CM | POA: Diagnosis not present

## 2023-09-22 DIAGNOSIS — J301 Allergic rhinitis due to pollen: Secondary | ICD-10-CM | POA: Diagnosis not present

## 2023-09-22 DIAGNOSIS — J3089 Other allergic rhinitis: Secondary | ICD-10-CM | POA: Diagnosis not present

## 2023-09-22 DIAGNOSIS — J3081 Allergic rhinitis due to animal (cat) (dog) hair and dander: Secondary | ICD-10-CM | POA: Diagnosis not present

## 2023-10-20 DIAGNOSIS — J3081 Allergic rhinitis due to animal (cat) (dog) hair and dander: Secondary | ICD-10-CM | POA: Diagnosis not present

## 2023-10-20 DIAGNOSIS — J301 Allergic rhinitis due to pollen: Secondary | ICD-10-CM | POA: Diagnosis not present

## 2023-10-20 DIAGNOSIS — J3089 Other allergic rhinitis: Secondary | ICD-10-CM | POA: Diagnosis not present

## 2023-11-07 DIAGNOSIS — J3081 Allergic rhinitis due to animal (cat) (dog) hair and dander: Secondary | ICD-10-CM | POA: Diagnosis not present

## 2023-11-07 DIAGNOSIS — J3089 Other allergic rhinitis: Secondary | ICD-10-CM | POA: Diagnosis not present

## 2023-11-20 DIAGNOSIS — J3089 Other allergic rhinitis: Secondary | ICD-10-CM | POA: Diagnosis not present

## 2023-11-20 DIAGNOSIS — J301 Allergic rhinitis due to pollen: Secondary | ICD-10-CM | POA: Diagnosis not present

## 2023-11-20 DIAGNOSIS — J3081 Allergic rhinitis due to animal (cat) (dog) hair and dander: Secondary | ICD-10-CM | POA: Diagnosis not present

## 2023-12-19 DIAGNOSIS — J3089 Other allergic rhinitis: Secondary | ICD-10-CM | POA: Diagnosis not present

## 2023-12-19 DIAGNOSIS — J3081 Allergic rhinitis due to animal (cat) (dog) hair and dander: Secondary | ICD-10-CM | POA: Diagnosis not present

## 2023-12-19 DIAGNOSIS — J301 Allergic rhinitis due to pollen: Secondary | ICD-10-CM | POA: Diagnosis not present

## 2023-12-20 DIAGNOSIS — E7800 Pure hypercholesterolemia, unspecified: Secondary | ICD-10-CM | POA: Diagnosis not present
